# Patient Record
Sex: Male | Born: 1955 | Race: Black or African American | Hispanic: No | Marital: Single | Smoking: Never smoker
Health system: Southern US, Community
[De-identification: ages and names within clinical notes are randomized; demographics above are authoritative.]

## PROBLEM LIST (undated history)

## (undated) DIAGNOSIS — G459 Transient cerebral ischemic attack, unspecified: Secondary | ICD-10-CM

## (undated) DIAGNOSIS — M8700 Idiopathic aseptic necrosis of unspecified bone: Secondary | ICD-10-CM

## (undated) DIAGNOSIS — I10 Essential (primary) hypertension: Secondary | ICD-10-CM

## (undated) DIAGNOSIS — M1990 Unspecified osteoarthritis, unspecified site: Secondary | ICD-10-CM

## (undated) DIAGNOSIS — G479 Sleep disorder, unspecified: Secondary | ICD-10-CM

## (undated) DIAGNOSIS — T7840XA Allergy, unspecified, initial encounter: Secondary | ICD-10-CM

## (undated) HISTORY — DX: Allergy, unspecified, initial encounter: T78.40XA

## (undated) HISTORY — DX: Transient cerebral ischemic attack, unspecified: G45.9

---

## 1987-07-07 HISTORY — PX: FINGER SURGERY: SHX640

## 1998-07-11 ENCOUNTER — Encounter: Admission: RE | Admit: 1998-07-11 | Discharge: 1998-07-11 | Payer: Self-pay | Admitting: Sports Medicine

## 1999-04-04 ENCOUNTER — Emergency Department (HOSPITAL_COMMUNITY): Admission: EM | Admit: 1999-04-04 | Discharge: 1999-04-04 | Payer: Self-pay | Admitting: *Deleted

## 1999-04-04 ENCOUNTER — Encounter: Payer: Self-pay | Admitting: Emergency Medicine

## 2003-07-07 DIAGNOSIS — G459 Transient cerebral ischemic attack, unspecified: Secondary | ICD-10-CM

## 2003-07-07 HISTORY — DX: Transient cerebral ischemic attack, unspecified: G45.9

## 2004-12-04 ENCOUNTER — Emergency Department (HOSPITAL_COMMUNITY): Admission: EM | Admit: 2004-12-04 | Discharge: 2004-12-05 | Payer: Self-pay | Admitting: Emergency Medicine

## 2006-12-31 ENCOUNTER — Ambulatory Visit: Payer: Self-pay | Admitting: *Deleted

## 2006-12-31 ENCOUNTER — Ambulatory Visit: Payer: Self-pay | Admitting: Family Medicine

## 2007-01-14 ENCOUNTER — Ambulatory Visit: Payer: Self-pay | Admitting: Internal Medicine

## 2007-03-11 ENCOUNTER — Ambulatory Visit: Payer: Self-pay | Admitting: Internal Medicine

## 2007-04-14 ENCOUNTER — Encounter (INDEPENDENT_AMBULATORY_CARE_PROVIDER_SITE_OTHER): Payer: Self-pay | Admitting: Nurse Practitioner

## 2007-04-14 ENCOUNTER — Ambulatory Visit: Payer: Self-pay | Admitting: Internal Medicine

## 2007-04-14 LAB — CONVERTED CEMR LAB
ALT: 37 units/L (ref 0–53)
AST: 21 units/L (ref 0–37)
Albumin: 4.5 g/dL (ref 3.5–5.2)
Alkaline Phosphatase: 64 units/L (ref 39–117)
BUN: 16 mg/dL (ref 6–23)
CO2: 27 meq/L (ref 19–32)
Calcium: 9.1 mg/dL (ref 8.4–10.5)
Chloride: 99 meq/L (ref 96–112)
Creatinine, Ser: 1.03 mg/dL (ref 0.40–1.50)
Glucose, Bld: 92 mg/dL (ref 70–99)
Potassium: 3.4 meq/L — ABNORMAL LOW (ref 3.5–5.3)
Sodium: 138 meq/L (ref 135–145)
Total Bilirubin: 2.7 mg/dL — ABNORMAL HIGH (ref 0.3–1.2)
Total Protein: 7.3 g/dL (ref 6.0–8.3)

## 2007-05-02 ENCOUNTER — Ambulatory Visit: Payer: Self-pay | Admitting: Family Medicine

## 2007-05-16 ENCOUNTER — Ambulatory Visit: Payer: Self-pay | Admitting: Family Medicine

## 2007-06-16 ENCOUNTER — Encounter (INDEPENDENT_AMBULATORY_CARE_PROVIDER_SITE_OTHER): Payer: Self-pay | Admitting: Nurse Practitioner

## 2007-06-16 ENCOUNTER — Ambulatory Visit: Payer: Self-pay | Admitting: Internal Medicine

## 2007-06-16 LAB — CONVERTED CEMR LAB: Potassium: 3.6 meq/L (ref 3.5–5.3)

## 2007-08-18 ENCOUNTER — Ambulatory Visit: Payer: Self-pay | Admitting: Internal Medicine

## 2008-02-29 ENCOUNTER — Ambulatory Visit: Payer: Self-pay | Admitting: Internal Medicine

## 2008-02-29 ENCOUNTER — Encounter (INDEPENDENT_AMBULATORY_CARE_PROVIDER_SITE_OTHER): Payer: Self-pay | Admitting: Family Medicine

## 2008-02-29 LAB — CONVERTED CEMR LAB
ALT: 35 units/L (ref 0–53)
AST: 24 units/L (ref 0–37)
Albumin: 4.8 g/dL (ref 3.5–5.2)
Alkaline Phosphatase: 68 units/L (ref 39–117)
BUN: 9 mg/dL (ref 6–23)
CO2: 24 meq/L (ref 19–32)
Calcium: 9.7 mg/dL (ref 8.4–10.5)
Chloride: 100 meq/L (ref 96–112)
Cholesterol: 202 mg/dL — ABNORMAL HIGH (ref 0–200)
Creatinine, Ser: 0.85 mg/dL (ref 0.40–1.50)
Glucose, Bld: 91 mg/dL (ref 70–99)
HDL: 44 mg/dL (ref >39)
LDL Cholesterol: 115 mg/dL — ABNORMAL HIGH (ref 0–99)
PSA: 0.36 ng/mL (ref 0.10–4.00)
Potassium: 3.2 meq/L — ABNORMAL LOW (ref 3.5–5.3)
Sodium: 137 meq/L (ref 135–145)
Testosterone: 234.4 ng/dL — ABNORMAL LOW (ref 350–890)
Total Bilirubin: 2.4 mg/dL — ABNORMAL HIGH (ref 0.3–1.2)
Total CHOL/HDL Ratio: 4.6
Total Protein: 7.9 g/dL (ref 6.0–8.3)
Triglycerides: 217 mg/dL — ABNORMAL HIGH (ref <150)
VLDL: 43 mg/dL — ABNORMAL HIGH (ref 0–40)

## 2011-04-09 ENCOUNTER — Emergency Department (HOSPITAL_COMMUNITY)
Admission: EM | Admit: 2011-04-09 | Discharge: 2011-04-09 | Disposition: A | Discharge status: Home / Self Care | Payer: Self-pay | Attending: Emergency Medicine | Admitting: Emergency Medicine

## 2011-04-09 DIAGNOSIS — L989 Disorder of the skin and subcutaneous tissue, unspecified: Secondary | ICD-10-CM | POA: Insufficient documentation

## 2011-04-09 DIAGNOSIS — N501 Vascular disorders of male genital organs: Secondary | ICD-10-CM | POA: Insufficient documentation

## 2011-04-09 DIAGNOSIS — I10 Essential (primary) hypertension: Secondary | ICD-10-CM | POA: Insufficient documentation

## 2013-12-13 ENCOUNTER — Emergency Department (HOSPITAL_COMMUNITY): Payer: No Typology Code available for payment source

## 2013-12-13 ENCOUNTER — Encounter (HOSPITAL_COMMUNITY): Payer: Self-pay | Admitting: Emergency Medicine

## 2013-12-13 ENCOUNTER — Emergency Department (HOSPITAL_COMMUNITY)
Admission: EM | Admit: 2013-12-13 | Discharge: 2013-12-13 | Disposition: A | Discharge status: Home / Self Care | Payer: No Typology Code available for payment source | Attending: Emergency Medicine | Admitting: Emergency Medicine

## 2013-12-13 DIAGNOSIS — Z79899 Other long term (current) drug therapy: Secondary | ICD-10-CM | POA: Insufficient documentation

## 2013-12-13 DIAGNOSIS — I10 Essential (primary) hypertension: Secondary | ICD-10-CM | POA: Insufficient documentation

## 2013-12-13 DIAGNOSIS — R29810 Facial weakness: Secondary | ICD-10-CM | POA: Insufficient documentation

## 2013-12-13 HISTORY — DX: Essential (primary) hypertension: I10

## 2013-12-13 LAB — CBC WITH DIFFERENTIAL/PLATELET
BASOS ABS: 0 10*3/uL (ref 0.0–0.1)
Basophils Relative: 0 % (ref 0–1)
EOS PCT: 2 % (ref 0–5)
Eosinophils Absolute: 0.1 10*3/uL (ref 0.0–0.7)
HEMATOCRIT: 42.1 % (ref 39.0–52.0)
HEMOGLOBIN: 14.6 g/dL (ref 13.0–17.0)
LYMPHS PCT: 20 % (ref 12–46)
Lymphs Abs: 0.7 10*3/uL (ref 0.7–4.0)
MCH: 29.3 pg (ref 26.0–34.0)
MCHC: 34.7 g/dL (ref 30.0–36.0)
MCV: 84.5 fL (ref 78.0–100.0)
MONO ABS: 0.5 10*3/uL (ref 0.1–1.0)
Monocytes Relative: 13 % — ABNORMAL HIGH (ref 3–12)
Neutro Abs: 2.4 10*3/uL (ref 1.7–7.7)
Neutrophils Relative %: 65 % (ref 43–77)
Platelets: 131 10*3/uL — ABNORMAL LOW (ref 150–400)
RBC: 4.98 MIL/uL (ref 4.22–5.81)
RDW: 12.5 % (ref 11.5–15.5)
WBC: 3.6 10*3/uL — AB (ref 4.0–10.5)

## 2013-12-13 LAB — COMPREHENSIVE METABOLIC PANEL
ALT: 29 U/L (ref 0–53)
AST: 22 U/L (ref 0–37)
Albumin: 3.9 g/dL (ref 3.5–5.2)
Alkaline Phosphatase: 55 U/L (ref 39–117)
BILIRUBIN TOTAL: 2.2 mg/dL — AB (ref 0.3–1.2)
BUN: 7 mg/dL (ref 6–23)
CALCIUM: 8.9 mg/dL (ref 8.4–10.5)
CO2: 25 mEq/L (ref 19–32)
Chloride: 99 mEq/L (ref 96–112)
Creatinine, Ser: 0.73 mg/dL (ref 0.50–1.35)
GFR calc non Af Amer: >90 mL/min (ref >90)
Glucose, Bld: 84 mg/dL (ref 70–99)
Potassium: 3.3 mEq/L — ABNORMAL LOW (ref 3.7–5.3)
Sodium: 140 mEq/L (ref 137–147)
Total Protein: 7.2 g/dL (ref 6.0–8.3)

## 2013-12-13 LAB — TROPONIN I

## 2013-12-13 LAB — PRO B NATRIURETIC PEPTIDE: PRO B NATRI PEPTIDE: 566.7 pg/mL — AB (ref 0–125)

## 2013-12-13 MED ORDER — HYDROCHLOROTHIAZIDE 25 MG PO TABS: 25.0000 mg | ORAL_TABLET | Freq: Every day | ORAL | Status: DC

## 2013-12-13 MED ORDER — CLONIDINE HCL 0.1 MG PO TABS
0.1000 mg | ORAL_TABLET | Freq: Once | ORAL | Status: AC
  Administered 2013-12-13: 0.1 mg via ORAL
  Filled 2013-12-13: qty 1

## 2013-12-13 MED ORDER — LABETALOL HCL 5 MG/ML IV SOLN
20.0000 mg | Freq: Once | INTRAVENOUS | Status: DC
  Filled 2013-12-13: qty 4

## 2013-12-13 NOTE — ED Notes (Signed)
Pt alert no distress 

## 2013-12-13 NOTE — ED Provider Notes (Signed)
CSN: [id number]     Arrival date & time 12/13/13  1047 History   First MD Initiated Contact with Patient 12/13/13 1128     Chief Complaint  Patient presents with  . Hypertension     (Consider location/radiation/quality/duration/timing/severity/associated sxs/prior Treatment) HPI Comments: [facility] from [facility] with elevated blood pressure. He denies any symptoms. He was getting his blood pressure checked at [facility]. He reports he used to be on blood pressure medications but has not taken any medications in the past 5 years. Denies any chest pain, shortness of breath, headache or visual change. Denies any fever or vomiting. Denies abdominal pain. Denies any focal weakness, numbness or tingling. He is a questionable facial droop on exam but he is not certain when this started.  The history is provided by the patient.    Past Medical History  Diagnosis Date  . HTN (hypertension)    History reviewed. No pertinent past surgical history. No family history on file. History  Substance Use Topics  . Smoking status: Never Smoker   . Smokeless tobacco: Not on file  . Alcohol Use: No    Review of Systems  Constitutional: Negative for activity change and appetite change.  Respiratory: Negative for cough, chest tightness and shortness of breath.   Cardiovascular: Negative for chest pain.  Gastrointestinal: Negative for nausea, vomiting and abdominal pain.  Genitourinary: Negative for dysuria and hematuria.  Musculoskeletal: Negative for arthralgias and back pain.  Skin: Negative for rash.  Neurological: Negative for dizziness, syncope and headaches.  A complete 10 system review of systems was obtained and all systems are negative except as noted in the HPI and PMH.      Allergies  Review of patient's allergies indicates no known allergies.  Home Medications   Prior to Admission medications   Medication Sig Start Date End Date Taking? Authorizing Provider   aspirin-acetaminophen-caffeine (EXCEDRIN MIGRAINE) [phone] MG per tablet Take 2 tablets by mouth every 6 (six) hours as needed for headache (pain).   Yes Historical Provider, MD  hydrochlorothiazide (HYDRODIURIL) 25 MG tablet Take 1 tablet (25 mg total) by mouth daily. 12/13/13   Ezequiel Essex, MD   BP 174/111  Pulse 60  Temp(Src) 98.3 F (36.8 C) (Oral)  Resp 20  SpO2 97% Physical Exam  Nursing note and vitals reviewed. Constitutional: He is oriented to person, place, and time. He appears well-developed and well-nourished. No distress.  HENT:  Head: Normocephalic and atraumatic.  Mouth/Throat: Oropharynx is clear and moist. No oropharyngeal exudate.  Eyes: Conjunctivae and EOM are normal. Pupils are equal, round, and reactive to light.  Neck: Normal range of motion. Neck supple.  No meningismus  Cardiovascular: Normal rate, regular rhythm and normal heart sounds.   No murmur heard. +2 femoral, DP, PT pulses  Pulmonary/Chest: Effort normal and breath sounds normal. No respiratory distress.  Abdominal: Soft. There is no tenderness. There is no rebound and no guarding.  Musculoskeletal: Normal range of motion. He exhibits no edema and no tenderness.  No calf swelling, tenderness, or palpable cords  Neurological: He is alert and oriented to person, place, and time. No cranial nerve deficit.  Possible L facial droop with ptosis and forehead flattening. Nasolabial fold flattening on R. no ataxia on finger to nose, no nystagmus, 5/5 strength throughout, no pronator drift, Romberg negative, normal gait.   Skin: Skin is warm and dry. No rash noted.  Psychiatric: He has a normal mood and affect.    ED Course  Procedures (including critical  care time) Labs Review Labs Reviewed  CBC WITH DIFFERENTIAL - Abnormal; Notable for the following:    WBC 3.6 (*)    Platelets 131 (*)    Monocytes Relative 13 (*)    All other components within normal limits  COMPREHENSIVE METABOLIC PANEL -  Abnormal; Notable for the following:    Potassium 3.3 (*)    Total Bilirubin 2.2 (*)    All other components within normal limits  PRO B NATRIURETIC PEPTIDE - Abnormal; Notable for the following:    Pro B Natriuretic peptide (BNP) 566.7 (*)    All other components within normal limits  TROPONIN I    Imaging Review Dg Chest 2 View  12/13/2013   CLINICAL DATA:  Hypertension  EXAM: CHEST  2 VIEW  COMPARISON:  None.  FINDINGS: Cardiac enlargement noted. No pleural effusion or edema. Both lungs are clear. The visualized skeletal structures are unremarkable.  IMPRESSION: 1. Cardiac enlargement. 2. No acute findings.   Electronically Signed   By: Kerby Moors M.D.   On: 12/13/2013 12:55   Ct Head Wo Contrast  12/13/2013   CLINICAL DATA:  Left facial droop.  EXAM: CT HEAD WITHOUT CONTRAST  TECHNIQUE: Contiguous axial images were obtained from the base of the skull through the vertex without contrast.  COMPARISON:  None  FINDINGS: No evidence for acute hemorrhage, mass lesion, midline shift, hydrocephalus or large infarct. There is a small area of cortical atrophy or encephalomalacia involving the anterior left frontal lobe, best seen on sequence 2, image 16. The patient may have an old burr hole along the left upper frontal bone which is probably associated with the small area of encephalomalacia. There is mucosal thickening with a polyp in the left maxillary sinus. Mild mucosal disease in posterior right ethmoid air cells. No acute bone abnormality.  IMPRESSION: No acute intracranial abnormality.  Small focus of encephalomalacia along the left frontal lobe.  Mild paranasal sinus disease.   Electronically Signed   By: Markus Daft M.D.   On: 12/13/2013 12:43   Mr Brain Wo Contrast  12/13/2013   CLINICAL DATA:  58 year old male with hypertension and left facial droop. Initial encounter.  EXAM: MRI HEAD WITHOUT CONTRAST  TECHNIQUE: Multiplanar, multiecho pulse sequences of the brain and surrounding  structures were obtained without intravenous contrast.  COMPARISON:  Head CT without contrast 1216 hr the same day.  FINDINGS: Major intracranial vascular flow voids are preserved. 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 Less pronounced bilateral ICA siphon tortuosity and ectasia.  No restricted diffusion to suggest acute infarction. No midline shift, mass effect, evidence of mass lesion, ventriculomegaly, extra-axial collection or acute intracranial hemorrhage. Cervicomedullary junction and pituitary are within normal limits. Negative visualized cervical spine.  Small focus of chronic encephalomalacia in the anterior left frontal lobe near midline, ste corresponding to the earlier CT finding. Series 5 and series 7, image 18. Chronic blood products at this site (series 8, image 17), and mild bilateral anterior frontal gyrus superficial siderosis.  No other cortical encephalomalacia. Other gray and white matter signal is normal.  Visible internal auditory structures appear normal. Mastoids are clear. Minor paranasal sinus mucosal thickening with small mucous retention cysts. Small superficial scanner subcutaneous cysts about the left lateral orbit (series 5, images 9 and 8). Other scalp soft tissues are within normal limits. Normal bone marrow signal.  IMPRESSION: 1.  No acute intracranial abnormality. 2.  Small focus of encephalomalacia in the anterior left frontal lobe with chronic blood products, and mild regional superficial siderosis. These might be the sequelae of remote trauma.   Electronically Signed   By: Lars Pinks M.D.   On: 12/13/2013 15:07     EKG Interpretation   Date/Time:  Wednesday December 13 2013 10:52:57 EDT Ventricular Rate:  65 PR Interval:  172 QRS Duration: 89 QT Interval:  435 QTC Calculation: 452 R Axis:   75 Text Interpretation:  Sinus rhythm Probable left atrial enlargement Left   ventricular hypertrophy Borderline T abnormalities, inferior leads No  previous ECGs available Confirmed by Wyvonnia Dusky  MD, Marigrace Mccole ([id number]) on  12/13/2013 11:23:44 AM      MDM   Final diagnoses:  Hypertension   [facility]. Denies any symptoms. No headache, chest pain, visual change or shortness of breath.   Neurological exam shows left-sided ptosis and questionable facial droop. Patient is unable to state if  this is new or old. CT without acute abnormality.  MRI with no infarct.  EKG reviewed with Dr. Orene Desanctis who feels T wave inversions represent LVH and not ischemia.  Patient has no chest pain or SOB.  Pressure improved to 185/86. Patient is asymptomatic. No chest pain or shortness of breath. No headache or visual change. Labs show no evidence of endorgan damage. Creatinine is normal. We'll start hydrochlorothiazide. Patient instructed to keep a log of his blood pressure and followup with his PCP.  BP 186/83  Pulse 66  Temp(Src) 98.3 F (36.8 C) (Oral)  Resp 18  SpO2 99%   Ezequiel Essex, MD 12/13/13 1819

## 2013-12-13 NOTE — Discharge Instructions (Signed)
Arterial Hypertension Follow up with the wellness center as scheduled. Keep a record of your blood pressure. Return to the ED if you develop new or worsening symptoms. Arterial hypertension (high blood pressure) is a condition of elevated pressure in your blood vessels. Hypertension over a long period of time is a risk factor for strokes, heart attacks, and heart failure. It is also the leading cause of kidney (renal) failure.  CAUSES   In Adults -- Over 90% of all hypertension has no known cause. This is called essential or primary hypertension. In the other 10% of people with hypertension, the increase in blood pressure is caused by another disorder. This is called secondary hypertension. Important causes of secondary hypertension are:  Heavy alcohol use.  Obstructive sleep apnea.  Hyperaldosterosim (Conn's syndrome).  Steroid use.  Chronic kidney failure.  Hyperparathyroidism.  Medications.  Renal artery stenosis.  Pheochromocytoma.  Cushing's disease.  Coarctation of the aorta.  Scleroderma renal crisis.  Licorice (in excessive amounts).  Drugs (cocaine, methamphetamine). Your caregiver can explain any items above that apply to you.  In Children -- Secondary hypertension is more common and should always be considered.  Pregnancy -- Few women of childbearing age have high blood pressure. However, up to 10% of them develop hypertension of pregnancy. Generally, this will not harm the woman. It may be a sign of 3 complications of pregnancy: preeclampsia, HELLP syndrome, and eclampsia. Follow up and control with medication is necessary. SYMPTOMS   This condition normally does not produce any noticeable symptoms. It is usually found during a routine exam.  Malignant hypertension is a late problem of high blood pressure. It may have the following symptoms:  Headaches.  Blurred vision.  End-organ damage (this means your kidneys, heart, lungs, and other organs are being  damaged).  Stressful situations can increase the blood pressure. If a person with normal blood pressure has their blood pressure go up while being seen by their caregiver, this is often termed "white coat hypertension." Its importance is not known. It may be related with eventually developing hypertension or complications of hypertension.  Hypertension is often confused with mental tension, stress, and anxiety. DIAGNOSIS  The diagnosis is made by 3 separate blood pressure measurements. They are taken at least 1 week apart from each other. If there is organ damage from hypertension, the diagnosis may be made without repeat measurements. Hypertension is usually identified by having blood pressure readings:  Above 140/90 mmHg measured in both arms, at 3 separate times, over a couple weeks.  Over 130/80 mmHg should be considered a risk factor and may require treatment in patients with diabetes. Blood pressure readings over 120/80 mmHg are called "pre-hypertension" even in non-diabetic patients. To get a true blood pressure measurement, use the following guidelines. Be aware of the factors that can alter blood pressure readings.  Take measurements at least 1 hour after caffeine.  Take measurements 30 minutes after smoking and without any stress. This is another reason to quit smoking  it raises your blood pressure.  Use a proper cuff size. Ask your caregiver if you are not sure about your cuff size.  Most home blood pressure cuffs are automatic. They will measure systolic and diastolic pressures. The systolic pressure is the pressure reading at the start of sounds. Diastolic pressure is the pressure at which the sounds disappear. If you are elderly, measure pressures in multiple postures. Try sitting, lying or standing.  Sit at rest for a minimum of 5 minutes before taking  measurements.  You should not be on any medications like decongestants. These are found in many cold medications.  Record  your blood pressure readings and review them with your caregiver. If you have hypertension:  Your caregiver may do tests to be sure you do not have secondary hypertension (see "causes" above).  Your caregiver may also look for signs of metabolic syndrome. This is also called Syndrome X or Insulin Resistance Syndrome. You may have this syndrome if you have type 2 diabetes, abdominal obesity, and abnormal blood lipids in addition to hypertension.  Your caregiver will take your medical and family history and perform a physical exam.  Diagnostic tests may include blood tests (for glucose, cholesterol, potassium, and kidney function), a urinalysis, or an EKG. Other tests may also be necessary depending on your condition. PREVENTION  There are important lifestyle issues that you can adopt to reduce your chance of developing hypertension:  Maintain a normal weight.  Limit the amount of salt (sodium) in your diet.  Exercise often.  Limit alcohol intake.  Get enough potassium in your diet. Discuss specific advice with your caregiver.  Follow a DASH diet (dietary approaches to stop hypertension). This diet is rich in fruits, vegetables, and low-fat dairy products, and avoids certain fats. PROGNOSIS  Essential hypertension cannot be cured. Lifestyle changes and medical treatment can lower blood pressure and reduce complications. The prognosis of secondary hypertension depends on the underlying cause. Many people whose hypertension is controlled with medicine or lifestyle changes can live a normal, healthy life.  RISKS AND COMPLICATIONS  While high blood pressure alone is not an illness, it often requires treatment due to its short- and long-term effects on many organs. Hypertension increases your risk for:  CVAs or strokes (cerebrovascular accident).  Heart failure due to chronically high blood pressure (hypertensive cardiomyopathy).  Heart attack (myocardial infarction).  Damage to the  retina (hypertensive retinopathy).  Kidney failure (hypertensive nephropathy). Your caregiver can explain list items above that apply to you. Treatment of hypertension can significantly reduce the risk of complications. TREATMENT   For overweight patients, weight loss and regular exercise are recommended. Physical fitness lowers blood pressure.  Mild hypertension is usually treated with diet and exercise. A diet rich in fruits and vegetables, fat-free dairy products, and foods low in fat and salt (sodium) can help lower blood pressure. Decreasing salt intake decreases blood pressure in a 1/3 of people.  Stop smoking if you are a smoker. The steps above are highly effective in reducing blood pressure. While these actions are easy to suggest, they are difficult to achieve. Most patients with moderate or severe hypertension end up requiring medications to bring their blood pressure down to a normal level. There are several classes of medications for treatment. Blood pressure pills (antihypertensives) will lower blood pressure by their different actions. Lowering the blood pressure by 10 mmHg may decrease the risk of complications by as much as 25%. The goal of treatment is effective blood pressure control. This will reduce your risk for complications. Your caregiver will help you determine the best treatment for you according to your lifestyle. What is excellent treatment for one person, may not be for you. HOME CARE INSTRUCTIONS   Do not smoke.  Follow the lifestyle changes outlined in the "Prevention" section.  If you are on medications, follow the directions carefully. Blood pressure medications must be taken as prescribed. Skipping doses reduces their benefit. It also puts you at risk for problems.  Follow up with your  caregiver, as directed.  If you are asked to monitor your blood pressure at home, follow the guidelines in the "Diagnosis" section above. SEEK MEDICAL CARE IF:   You think  you are having medication side effects.  You have recurrent headaches or lightheadedness.  You have swelling in your ankles.  You have trouble with your vision. SEEK IMMEDIATE MEDICAL CARE IF:   You have sudden onset of chest pain or pressure, difficulty breathing, or other symptoms of a heart attack.  You have a severe headache.  You have symptoms of a stroke (such as sudden weakness, difficulty speaking, difficulty walking). MAKE SURE YOU:   Understand these instructions.  Will watch your condition.  Will get help right away if you are not doing well or get worse. Document Released: 06/22/2005 Document Revised: 09/14/2011 Document Reviewed: 01/20/2007 [facility] Patient Information 2014 [place].

## 2013-12-13 NOTE — ED Notes (Signed)
A/Ox 4, warm and dry, skin color good.

## 2013-12-13 NOTE — ED Notes (Signed)
Patient transported to CT 

## 2013-12-13 NOTE — Discharge Planning (Signed)
[facility]  Follow up appointment made with Family Medicine @ [facility] for  Wed June 17,2015 at 3:15pm. Patient aware of this appointment. My contact information provided for any future questions or concerns. No other needs expressed at this time.

## 2013-12-13 NOTE — ED Notes (Signed)
Pt in MRI-- was in CT scan prior--will give meds when he returns.

## 2013-12-13 NOTE — ED Notes (Signed)
TO ED via [facility] from [facility] with c/o high blood pressure-- was dx in 2009, has not taken medicine since then, has not had a regular dr since then. Needs to bring BP down in order to get hired at a job. No symptoms, was given Labetolol 100mg  and ASA 324mg  at [facility].

## 2014-05-15 ENCOUNTER — Encounter: Payer: Self-pay | Admitting: Internal Medicine

## 2014-06-11 ENCOUNTER — Emergency Department (HOSPITAL_COMMUNITY): Payer: No Typology Code available for payment source

## 2014-06-11 ENCOUNTER — Emergency Department (INDEPENDENT_AMBULATORY_CARE_PROVIDER_SITE_OTHER)
Admission: EM | Admit: 2014-06-11 | Discharge: 2014-06-11 | Disposition: A | Discharge status: Home / Self Care | Payer: No Typology Code available for payment source | Source: Home / Self Care | Attending: Family Medicine | Admitting: Family Medicine

## 2014-06-11 ENCOUNTER — Encounter (HOSPITAL_COMMUNITY): Payer: Self-pay | Admitting: Emergency Medicine

## 2014-06-11 ENCOUNTER — Emergency Department (INDEPENDENT_AMBULATORY_CARE_PROVIDER_SITE_OTHER): Payer: No Typology Code available for payment source

## 2014-06-11 DIAGNOSIS — M25569 Pain in unspecified knee: Secondary | ICD-10-CM

## 2014-06-11 DIAGNOSIS — E876 Hypokalemia: Secondary | ICD-10-CM

## 2014-06-11 DIAGNOSIS — M1990 Unspecified osteoarthritis, unspecified site: Secondary | ICD-10-CM

## 2014-06-11 DIAGNOSIS — I10 Essential (primary) hypertension: Secondary | ICD-10-CM

## 2014-06-11 MED ORDER — CAPSAICIN 0.075 % EX GEL: CUTANEOUS | Status: DC

## 2014-06-11 MED ORDER — POTASSIUM CHLORIDE ER 20 MEQ PO TBCR: 20.0000 meq | EXTENDED_RELEASE_TABLET | Freq: Every day | ORAL | Status: DC

## 2014-06-11 MED ORDER — HYDROCHLOROTHIAZIDE 50 MG PO TABS: 50.0000 mg | ORAL_TABLET | Freq: Every day | ORAL | Status: DC

## 2014-06-11 MED ORDER — DICLOFENAC SODIUM 75 MG PO TBEC: 75.0000 mg | DELAYED_RELEASE_TABLET | Freq: Two times a day (BID) | ORAL | Status: DC

## 2014-06-11 NOTE — Discharge Instructions (Signed)
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

## 2014-06-11 NOTE — ED Notes (Signed)
Gave pt AVS and went over his d/c instructions.  Pt stated understanding.  Pt does seem to be a bit confused about his BP medications and I advised he contact the PCP that prescribed his medications to clarify what he is to take everyday.  Pt stated he would do that.  Pt walked to d/c independently with all personal belongings.

## 2014-06-11 NOTE — ED Provider Notes (Addendum)
CSN: [id number]     Arrival date & time 06/11/14  0920 History   First MD Initiated Contact with Patient 06/11/14 1109     Chief Complaint  Patient presents with  . Knee Pain    left   (Consider location/radiation/quality/duration/timing/severity/associated sxs/prior Treatment) HPI   HTN: 3 wks ago Dx w/ HTN and started on HCTZ. Takes daily. Denies CP, SOb, Palpitations., HA  L knee started hurting 2 wks ago. Getting worse. Chronic problem for pt. Worse in winters and better in the summers. Topical ointment and ACE wrap w/ minimal benefit. Aleve BID w/ some improvement. Worse w/ ambulation    Past Medical History  Diagnosis Date  . HTN (hypertension)    History reviewed. No pertinent past surgical history. History reviewed. No pertinent family history. History  Substance Use Topics  . Smoking status: Never Smoker   . Smokeless tobacco: Not on file  . Alcohol Use: No    Review of Systems Per HPI with all other pertinent systems negative.   Allergies  Review of patient's allergies indicates no known allergies.  Home Medications   Prior to Admission medications   Medication Sig Start Date End Date Taking? Authorizing Provider  aspirin-acetaminophen-caffeine (EXCEDRIN MIGRAINE) [id number] MG per tablet Take 2 tablets by mouth every 6 (six) hours as needed for headache (pain).    Historical Provider, MD  Capsaicin 0.075 % GEL Apply topically 3-4 times daily for pain relief 06/11/14   Waldemar Dickens, MD  diclofenac (VOLTAREN) 75 MG EC tablet Take 1 tablet (75 mg total) by mouth 2 (two) times daily. 06/11/14   Waldemar Dickens, MD  hydrochlorothiazide (HYDRODIURIL) 50 MG tablet Take 1 tablet (50 mg total) by mouth daily. 06/11/14   Waldemar Dickens, MD  potassium chloride 20 MEQ TBCR Take 20 mEq by mouth daily. 06/11/14   Waldemar Dickens, MD   BP 170/112 mmHg  Pulse 80  Temp(Src) 99.7 F (37.6 C) (Oral)  SpO2 97% Physical Exam  Constitutional: He is oriented to person, place,  and time. He appears well-developed and well-nourished. No distress.  HENT:  Head: Normocephalic and atraumatic.  Eyes: EOM are normal. Pupils are equal, round, and reactive to light.  Neck: Normal range of motion.  Cardiovascular: Normal rate, normal heart sounds and intact distal pulses.   No murmur heard. Pulmonary/Chest: Effort normal and breath sounds normal.  Abdominal: Soft. Bowel sounds are normal.  Musculoskeletal: Normal range of motion.  L knee FROM, nonttp, no effusion. No crepitus, Lachman's negative. Valgus and Varus stresses w/o pain. Pt walks w/ a limp, favoring L. Apprehension test negative.   Neurological: He is alert and oriented to person, place, and time. No cranial nerve deficit. Coordination normal.  Skin: Skin is warm and dry. He is not diaphoretic.  Psychiatric: His behavior is normal. Judgment and thought content normal.    ED Course  Procedures (including critical care time) Labs Review Labs Reviewed - No data to display  Imaging Review No results found.   MDM   1. Arthritis   2. Knee pain   3. Essential hypertension   4. Hypokalemia    Xray official read not available due to technical problems w/ the computers. Mild arthritic changes medially  L knee arthritis: Start voltaren oral, Capsaizin, exercises,   Last K of 3.3 in June. Not taking replacement therapy and on HCTZ now. Start daily Kdur  HTN: marked elevation today. Started on BP meds on 05/21/14. Called pharmacy for clarification and pt  is supporsed to be taking HCTZ, lisinopril and clonidine. Pt unsure of actual regimen. Pt to increase HCTZ to 50mg  if taking other meds as prescribed otherwise take regimen as previously prescribed. . F/u PCP in 1-2 wks   Precautions given and all questions answered  Linna Darner, MD Family Medicine 06/11/2014, 12:08 PM    Waldemar Dickens, MD 06/11/14 [place], MD 06/11/14 1220

## 2014-06-11 NOTE — ED Notes (Signed)
Pt states he fell this summer, but the knee did not bother him then.  He has noticed increased pain in the last week.  Pt also has an elevated blood pressure.  He states he took his BP medication this morning.

## 2014-06-11 NOTE — ED Notes (Signed)
This BP was taken at 1005

## 2014-07-16 ENCOUNTER — Ambulatory Visit: Payer: No Typology Code available for payment source | Admitting: Internal Medicine

## 2014-08-01 ENCOUNTER — Emergency Department (INDEPENDENT_AMBULATORY_CARE_PROVIDER_SITE_OTHER)
Admission: EM | Admit: 2014-08-01 | Discharge: 2014-08-01 | Disposition: A | Discharge status: Home / Self Care | Payer: No Typology Code available for payment source | Source: Home / Self Care | Attending: Family Medicine | Admitting: Family Medicine

## 2014-08-01 ENCOUNTER — Encounter (HOSPITAL_COMMUNITY): Payer: Self-pay | Admitting: *Deleted

## 2014-08-01 DIAGNOSIS — M179 Osteoarthritis of knee, unspecified: Secondary | ICD-10-CM

## 2014-08-01 DIAGNOSIS — M1712 Unilateral primary osteoarthritis, left knee: Secondary | ICD-10-CM

## 2014-08-01 MED ORDER — METHYLPREDNISOLONE ACETATE 40 MG/ML IJ SUSP
INTRAMUSCULAR | Status: AC
  Filled 2014-08-01: qty 1

## 2014-08-01 MED ORDER — DICLOFENAC SODIUM 75 MG PO TBEC: 75.0000 mg | DELAYED_RELEASE_TABLET | Freq: Two times a day (BID) | ORAL | Status: DC

## 2014-08-01 MED ORDER — LIDOCAINE HCL 2 % IJ SOLN
INTRAMUSCULAR | Status: AC
  Filled 2014-08-01: qty 20

## 2014-08-01 MED ORDER — METHYLPREDNISOLONE ACETATE 40 MG/ML IJ SUSP
40.0000 mg | Freq: Once | INTRAMUSCULAR | Status: AC
  Administered 2014-08-01: 40 mg via INTRA_ARTICULAR

## 2014-08-01 NOTE — ED Notes (Signed)
Pt  Reports         Pain  l  Knee           Since the  1st  Of  Jan      -      denys  Any  spefic  Injury          Symptoms  Not  releived  By   OTC meds       Pt requesting  A   Refill  Of  His voltaren

## 2014-08-01 NOTE — Discharge Instructions (Signed)
Joint Injection Care After Refer to this sheet in the next few days. These instructions provide you with information on caring for yourself after you have had a joint injection. Your caregiver also may give you more specific instructions. Your treatment has been planned according to current medical practices, but problems sometimes occur. Call your caregiver if you have any problems or questions after your procedure. After any type of joint injection, it is not uncommon to experience:  Soreness, swelling, or bruising around the injection site.  Mild numbness, tingling, or weakness around the injection site caused by the numbing medicine used before or with the injection. It also is possible to experience the following effects associated with the specific agent after injection:  Iodine-based contrast agents:  Allergic reaction (itching, hives, widespread redness, and swelling beyond the injection site).  Corticosteroids (These effects are rare.):  Allergic reaction.  Increased blood sugar levels (If you have diabetes and you notice that your blood sugar levels have increased, notify your caregiver).  Increased blood pressure levels.  Mood swings.  Hyaluronic acid in the use of viscosupplementation.  Temporary heat or redness.  Temporary rash and itching.  Increased fluid accumulation in the injected joint. These effects all should resolve within a day after your procedure.  HOME CARE INSTRUCTIONS  Limit yourself to light activity the day of your procedure. Avoid lifting heavy objects, bending, stooping, or twisting.  Take prescription or over-the-counter pain medication as directed by your caregiver.  You may apply ice to your injection site to reduce pain and swelling the day of your procedure. Ice may be applied 03-04 times:  Put ice in a plastic bag.  Place a towel between your skin and the bag.  Leave the ice on for no longer than 15-20 minutes each time. SEEK  IMMEDIATE MEDICAL CARE IF:   Pain and swelling get worse rather than better or extend beyond the injection site.  Numbness does not go away.  Blood or fluid continues to leak from the injection site.  You have chest pain.  You have swelling of your face or tongue.  You have trouble breathing or you become dizzy.  You develop a fever, chills, or severe tenderness at the injection site that last longer than 1 day. MAKE SURE YOU:  Understand these instructions.  Watch your condition.  Get help right away if you are not doing well or if you get worse. Document Released: 03/05/2011 Document Revised: 09/14/2011 Document Reviewed: 03/05/2011 [facility] Patient Information 2015 [place], [place]. This information is not intended to replace advice given to you by your health care provider. Make sure you discuss any questions you have with your health care provider.  Arthritis, Nonspecific Arthritis is inflammation of a joint. This usually means pain, redness, warmth or swelling are present. One or more joints may be involved. There are a number of types of arthritis. Your caregiver may not be able to tell what type of arthritis you have right away. CAUSES  The most common cause of arthritis is the wear and tear on the joint (osteoarthritis). This causes damage to the cartilage, which can break down over time. The knees, hips, back and neck are most often affected by this type of arthritis. Other types of arthritis and common causes of joint pain include:  Sprains and other injuries near the joint. Sometimes minor sprains and injuries cause pain and swelling that develop hours later.  Rheumatoid arthritis. This affects hands, feet and knees. It usually affects both sides of  your body at the same time. It is often associated with chronic ailments, fever, weight loss and general weakness.  Crystal arthritis. Gout and pseudo gout can cause occasional acute severe pain, redness and swelling in the  foot, ankle, or knee.  Infectious arthritis. Bacteria can get into a joint through a break in overlying skin. This can cause infection of the joint. Bacteria and viruses can also spread through the blood and affect your joints.  Drug, infectious and allergy reactions. Sometimes joints can become mildly painful and slightly swollen with these types of illnesses. SYMPTOMS   Pain is the main symptom.  Your joint or joints can also be red, swollen and warm or hot to the touch.  You may have a fever with certain types of arthritis, or even feel overall ill.  The joint with arthritis will hurt with movement. Stiffness is present with some types of arthritis. DIAGNOSIS  Your caregiver will suspect arthritis based on your description of your symptoms and on your exam. Testing may be needed to find the type of arthritis:  Blood and sometimes urine tests.  X-ray tests and sometimes CT or MRI scans.  Removal of fluid from the joint (arthrocentesis) is done to check for bacteria, crystals or other causes. Your caregiver (or a specialist) will numb the area over the joint with a local anesthetic, and use a needle to remove joint fluid for examination. This procedure is only minimally uncomfortable.  Even with these tests, your caregiver may not be able to tell what kind of arthritis you have. Consultation with a specialist (rheumatologist) may be helpful. TREATMENT  Your caregiver will discuss with you treatment specific to your type of arthritis. If the specific type cannot be determined, then the following general recommendations may apply. Treatment of severe joint pain includes:  Rest.  Elevation.  Anti-inflammatory medication (for example, ibuprofen) may be prescribed. Avoiding activities that cause increased pain.  Only take over-the-counter or prescription medicines for pain and discomfort as recommended by your caregiver.  Cold packs over an inflamed joint may be used for 10 to 15  minutes every hour. Hot packs sometimes feel better, but do not use overnight. Do not use hot packs if you are diabetic without your caregiver's permission.  A cortisone shot into arthritic joints may help reduce pain and swelling.  Any acute arthritis that gets worse over the next 1 to 2 days needs to be looked at to be sure there is no joint infection. Long-term arthritis treatment involves modifying activities and lifestyle to reduce joint stress jarring. This can include weight loss. Also, exercise is needed to nourish the joint cartilage and remove waste. This helps keep the muscles around the joint strong. HOME CARE INSTRUCTIONS   Do not take aspirin to relieve pain if gout is suspected. This elevates uric acid levels.  Only take over-the-counter or prescription medicines for pain, discomfort or fever as directed by your caregiver.  Rest the joint as much as possible.  If your joint is swollen, keep it elevated.  Use crutches if the painful joint is in your leg.  Drinking plenty of fluids may help for certain types of arthritis.  Follow your caregiver's dietary instructions.  Try low-impact exercise such as:  Swimming.  Water aerobics.  Biking.  Walking.  Morning stiffness is often relieved by a warm shower.  Put your joints through regular range-of-motion. SEEK MEDICAL CARE IF:   You do not feel better in 24 hours or are getting worse.  You have side effects to medications, or are not getting better with treatment. SEEK IMMEDIATE MEDICAL CARE IF:   You have a fever.  You develop severe joint pain, swelling or redness.  Many joints are involved and become painful and swollen.  There is severe back pain and/or leg weakness.  You have loss of bowel or bladder control. Document Released: 07/30/2004 Document Revised: 09/14/2011 Document Reviewed: 08/15/2008 [employer] Patient Information 2015 [place], [place]. This information is not intended to replace advice given  to you by your health care provider. Make sure you discuss any questions you have with your health care provider.  Knee Pain The knee is the complex joint between your thigh and your lower leg. It is made up of bones, tendons, ligaments, and cartilage. The bones that make up the knee are:  The femur in the thigh.  The tibia and fibula in the lower leg.  The patella or kneecap riding in the groove on the lower femur. CAUSES  Knee pain is a common complaint with many causes. A few of these causes are:  Injury, such as:  A ruptured ligament or tendon injury.  Torn cartilage.  Medical conditions, such as:  Gout  Arthritis  Infections  Overuse, over training, or overdoing a physical activity. Knee pain can be minor or severe. Knee pain can accompany debilitating injury. Minor knee problems often respond well to self-care measures or get well on their own. More serious injuries may need medical intervention or even surgery. SYMPTOMS The knee is complex. Symptoms of knee problems can vary widely. Some of the problems are:  Pain with movement and weight bearing.  Swelling and tenderness.  Buckling of the knee.  Inability to straighten or extend your knee.  Your knee locks and you cannot straighten it.  Warmth and redness with pain and fever.  Deformity or dislocation of the kneecap. DIAGNOSIS  Determining what is wrong may be very straight forward such as when there is an injury. It can also be challenging because of the complexity of the knee. Tests to make a diagnosis may include:  Your caregiver taking a history and doing a physical exam.  Routine X-rays can be used to rule out other problems. X-rays will not reveal a cartilage tear. Some injuries of the knee can be diagnosed by:  Arthroscopy a surgical technique by which a small video camera is inserted through tiny incisions on the sides of the knee. This procedure is used to examine and repair internal knee joint  problems. Tiny instruments can be used during arthroscopy to repair the torn knee cartilage (meniscus).  Arthrography is a radiology technique. A contrast liquid is directly injected into the knee joint. Internal structures of the knee joint then become visible on X-ray film.  An MRI scan is a non X-ray radiology procedure in which magnetic fields and a computer produce two- or three-dimensional images of the inside of the knee. Cartilage tears are often visible using an MRI scanner. MRI scans have largely replaced arthrography in diagnosing cartilage tears of the knee.  Blood work.  Examination of the fluid that helps to lubricate the knee joint (synovial fluid). This is done by taking a sample out using a needle and a syringe. TREATMENT The treatment of knee problems depends on the cause. Some of these treatments are:  Depending on the injury, proper casting, splinting, surgery, or physical therapy care will be needed.  Give yourself adequate recovery time. Do not overuse your joints. If you begin  to get sore during workout routines, back off. Slow down or do fewer repetitions.  For repetitive activities such as cycling or running, maintain your strength and nutrition.  Alternate muscle groups. For example, if you are a weight lifter, work the upper body on one day and the lower body the next.  Either tight or weak muscles do not give the proper support for your knee. Tight or weak muscles do not absorb the stress placed on the knee joint. Keep the muscles surrounding the knee strong.  Take care of mechanical problems.  If you have flat feet, orthotics or special shoes may help. See your caregiver if you need help.  Arch supports, sometimes with wedges on the inner or outer aspect of the heel, can help. These can shift pressure away from the side of the knee most bothered by osteoarthritis.  A brace called an "unloader" brace also may be used to help ease the pressure on the most  arthritic side of the knee.  If your caregiver has prescribed crutches, braces, wraps or ice, use as directed. The acronym for this is PRICE. This means protection, rest, ice, compression, and elevation.  Nonsteroidal anti-inflammatory drugs (NSAIDs), can help relieve pain. But if taken immediately after an injury, they may actually increase swelling. Take NSAIDs with food in your stomach. Stop them if you develop stomach problems. Do not take these if you have a history of ulcers, stomach pain, or bleeding from the bowel. Do not take without your caregiver's approval if you have problems with fluid retention, heart failure, or kidney problems.  For ongoing knee problems, physical therapy may be helpful.  Glucosamine and chondroitin are over-the-counter dietary supplements. Both may help relieve the pain of osteoarthritis in the knee. These medicines are different from the usual anti-inflammatory drugs. Glucosamine may decrease the rate of cartilage destruction.  Injections of a corticosteroid drug into your knee joint may help reduce the symptoms of an arthritis flare-up. They may provide pain relief that lasts a few months. You may have to wait a few months between injections. The injections do have a small increased risk of infection, water retention, and elevated blood sugar levels.  Hyaluronic acid injected into damaged joints may ease pain and provide lubrication. These injections may work by reducing inflammation. A series of shots may give relief for as long as 6 months.  Topical painkillers. Applying certain ointments to your skin may help relieve the pain and stiffness of osteoarthritis. Ask your pharmacist for suggestions. Many over the-counter products are approved for temporary relief of arthritis pain.  In some countries, doctors often prescribe topical NSAIDs for relief of chronic conditions such as arthritis and tendinitis. A review of treatment with NSAID creams found that they  worked as well as oral medications but without the serious side effects. PREVENTION  Maintain a healthy weight. Extra pounds put more strain on your joints.  Get strong, stay limber. Weak muscles are a common cause of knee injuries. Stretching is important. Include flexibility exercises in your workouts.  Be smart about exercise. If you have osteoarthritis, chronic knee pain or recurring injuries, you may need to change the way you exercise. This does not mean you have to stop being active. If your knees ache after jogging or playing basketball, consider switching to swimming, water aerobics, or other low-impact activities, at least for a few days a week. Sometimes limiting high-impact activities will provide relief.  Make sure your shoes fit well. Choose footwear that is right  for your sport.  Protect your knees. Use the proper gear for knee-sensitive activities. Use kneepads when playing volleyball or laying carpet. Buckle your seat belt every time you drive. Most shattered kneecaps occur in car accidents.  Rest when you are tired. SEEK MEDICAL CARE IF:  You have knee pain that is continual and does not seem to be getting better.  SEEK IMMEDIATE MEDICAL CARE IF:  Your knee joint feels hot to the touch and you have a high fever. MAKE SURE YOU:   Understand these instructions.  Will watch your condition.  Will get help right away if you are not doing well or get worse. Document Released: 04/19/2007 Document Revised: 09/14/2011 Document Reviewed: 04/19/2007 [facility] Patient Information 2015 [place], [place]. This information is not intended to replace advice given to you by your health care provider. Make sure you discuss any questions you have with your health care provider.  Knee Exercises EXERCISES RANGE OF MOTION (ROM) AND STRETCHING EXERCISES These exercises may help you when beginning to rehabilitate your injury. Your symptoms may resolve with or without further involvement from  your physician, physical therapist, or athletic trainer. While completing these exercises, remember:   Restoring tissue flexibility helps normal motion to return to the joints. This allows healthier, less painful movement and activity.  An effective stretch should be held for at least 30 seconds.  A stretch should never be painful. You should only feel a gentle lengthening or release in the stretched tissue. STRETCH - Knee Extension, Prone  Lie on your stomach on a firm surface, such as a bed or countertop. Place your right / left knee and leg just beyond the edge of the surface. You may wish to place a towel under the far end of your right / left thigh for comfort.  Relax your leg muscles and allow gravity to straighten your knee. Your clinician may advise you to add an ankle weight if more resistance is helpful for you.  You should feel a stretch in the back of your right / left knee. Hold this position for __________ seconds. Repeat __________ times. Complete this stretch __________ times per day. * Your physician, physical therapist, or athletic trainer may ask you to add ankle weight to enhance your stretch.  RANGE OF MOTION - Knee Flexion, Active  Lie on your back with both knees straight. (If this causes back discomfort, bend your opposite knee, placing your foot flat on the floor.)  Slowly slide your heel back toward your buttocks until you feel a gentle stretch in the front of your knee or thigh.  Hold for __________ seconds. Slowly slide your heel back to the starting position. Repeat __________ times. Complete this exercise __________ times per day.  STRETCH - Quadriceps, Prone   Lie on your stomach on a firm surface, such as a bed or padded floor.  Bend your right / left knee and grasp your ankle. If you are unable to reach your ankle or pant leg, use a belt around your foot to lengthen your reach.  Gently pull your heel toward your buttocks. Your knee should not slide out  to the side. You should feel a stretch in the front of your thigh and/or knee.  Hold this position for __________ seconds. Repeat __________ times. Complete this stretch __________ times per day.  STRETCH - Hamstrings, Supine   Lie on your back. Loop a belt or towel over the ball of your right / left foot.  Straighten your right / left  knee and slowly pull on the belt to raise your leg. Do not allow the right / left knee to bend. Keep your opposite leg flat on the floor.  Raise the leg until you feel a gentle stretch behind your right / left knee or thigh. Hold this position for __________ seconds. Repeat __________ times. Complete this stretch __________ times per day.  STRENGTHENING EXERCISES These exercises may help you when beginning to rehabilitate your injury. They may resolve your symptoms with or without further involvement from your physician, physical therapist, or athletic trainer. While completing these exercises, remember:   Muscles can gain both the endurance and the strength needed for everyday activities through controlled exercises.  Complete these exercises as instructed by your physician, physical therapist, or athletic trainer. Progress the resistance and repetitions only as guided.  You may experience muscle soreness or fatigue, but the pain or discomfort you are trying to eliminate should never worsen during these exercises. If this pain does worsen, stop and make certain you are following the directions exactly. If the pain is still present after adjustments, discontinue the exercise until you can discuss the trouble with your clinician. STRENGTH - Quadriceps, Isometrics  Lie on your back with your right / left leg extended and your opposite knee bent.  Gradually tense the muscles in the front of your right / left thigh. You should see either your knee cap slide up toward your hip or increased dimpling just above the knee. This motion will push the back of the knee down  toward the floor/mat/bed on which you are lying.  Hold the muscle as tight as you can without increasing your pain for __________ seconds.  Relax the muscles slowly and completely in between each repetition. Repeat __________ times. Complete this exercise __________ times per day.  STRENGTH - Quadriceps, Short Arcs   Lie on your back. Place a __________ inch towel roll under your knee so that the knee slightly bends.  Raise only your lower leg by tightening the muscles in the front of your thigh. Do not allow your thigh to rise.  Hold this position for __________ seconds. Repeat __________ times. Complete this exercise __________ times per day.  OPTIONAL ANKLE WEIGHTS: Begin with ____________________, but DO NOT exceed ____________________. Increase in 1 pound/0.5 kilogram increments.  STRENGTH - Quadriceps, Straight Leg Raises  Quality counts! Watch for signs that the quadriceps muscle is working to insure you are strengthening the correct muscles and not "cheating" by substituting with healthier muscles.  Lay on your back with your right / left leg extended and your opposite knee bent.  Tense the muscles in the front of your right / left thigh. You should see either your knee cap slide up or increased dimpling just above the knee. Your thigh may even quiver.  Tighten these muscles even more and raise your leg 4 to 6 inches off the floor. Hold for __________ seconds.  Keeping these muscles tense, lower your leg.  Relax the muscles slowly and completely in between each repetition. Repeat __________ times. Complete this exercise __________ times per day.  STRENGTH - Hamstring, Curls  Lay on your stomach with your legs extended. (If you lay on a bed, your feet may hang over the edge.)  Tighten the muscles in the back of your thigh to bend your right / left knee up to 90 degrees. Keep your hips flat on the bed/floor.  Hold this position for __________ seconds.  Slowly lower your leg  back to  the starting position. Repeat __________ times. Complete this exercise __________ times per day.  OPTIONAL ANKLE WEIGHTS: Begin with ____________________, but DO NOT exceed ____________________. Increase in 1 pound/0.5 kilogram increments.  STRENGTH - Quadriceps, Squats  Stand in a door frame so that your feet and knees are in line with the frame.  Use your hands for balance, not support, on the frame.  Slowly lower your weight, bending at the hips and knees. Keep your lower legs upright so that they are parallel with the door frame. Squat only within the range that does not increase your knee pain. Never let your hips drop below your knees.  Slowly return upright, pushing with your legs, not pulling with your hands. Repeat __________ times. Complete this exercise __________ times per day.  STRENGTH - Quadriceps, Wall Slides  Follow guidelines for form closely. Increased knee pain often results from poorly placed feet or knees.  Lean against a smooth wall or door and walk your feet out 18-24 inches. Place your feet hip-width apart.  Slowly slide down the wall or door until your knees bend __________ degrees.* Keep your knees over your heels, not your toes, and in line with your hips, not falling to either side.  Hold for __________ seconds. Stand up to rest for __________ seconds in between each repetition. Repeat __________ times. Complete this exercise __________ times per day. * Your physician, physical therapist, or athletic trainer will alter this angle based on your symptoms and progress. Document Released: 05/06/2005 Document Revised: 11/06/2013 Document Reviewed: 10/04/2008 [facility] Patient Information 2015 [place], [place]. This information is not intended to replace advice given to you by your health care provider. Make sure you discuss any questions you have with your health care provider.

## 2014-08-01 NOTE — ED Provider Notes (Signed)
CSN: [id number]     Arrival date & time 08/01/14  1013 History   First MD Initiated Contact with Patient 08/01/14 1026     Chief Complaint  Patient presents with  . Knee Pain   (Consider location/radiation/quality/duration/timing/severity/associated sxs/prior Treatment) HPI           59 year old male presents complaining of left knee pain. He has had left knee pain for about 6 months. 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 He was started on diclofenac 75 mg twice a day. He has been taking this and it is somewhat helpful but he has run out. The pain is worse in the morning and gets better throughout the day. He is requesting a referral to an orthopedist for joint injection today. No extremity numbness, weakness, or swelling. There is mild swelling in the medial portion of the knee.   Past Medical History  Diagnosis Date  . HTN (hypertension)    History reviewed. No pertinent past surgical history. History reviewed. No pertinent family history. History  Substance Use Topics  . Smoking status: Never Smoker   . Smokeless tobacco: Not on file  . Alcohol Use: No    Review of Systems  Musculoskeletal: Positive for joint swelling and arthralgias.  All other systems reviewed and are negative.   Allergies  Review of patient's allergies indicates no known allergies.  Home Medications   Prior to Admission medications   Medication Sig Start Date End Date Taking? Authorizing Provider  aspirin-acetaminophen-caffeine (EXCEDRIN MIGRAINE) [phone] MG per tablet Take 2 tablets by mouth every 6 (six) hours as needed for headache (pain).    Historical Provider, MD  Capsaicin 0.075 % GEL Apply topically 3-4 times daily for pain relief 06/11/14   Waldemar Dickens, MD  diclofenac (VOLTAREN) 75 MG EC tablet Take 1 tablet (75 mg total) by mouth 2 (two) times daily. 08/01/14   Freeman Caldron Reilyn Nelson, PA-C   hydrochlorothiazide (HYDRODIURIL) 50 MG tablet Take 1 tablet (50 mg total) by mouth daily. 06/11/14   Waldemar Dickens, MD  potassium chloride 20 MEQ TBCR Take 20 mEq by mouth daily. 06/11/14   Waldemar Dickens, MD   BP 161/85 mmHg  Pulse 81  Temp(Src) 98.3 F (36.8 C) (Oral)  Resp 16  SpO2 97% Physical Exam  Constitutional: He is oriented to person, place, and time. He appears well-developed and well-nourished. No distress.  HENT:  Head: Normocephalic.  Neck: No tracheal deviation present.  Cardiovascular: Intact distal pulses.   Pulmonary/Chest: Effort normal. No respiratory distress.  Musculoskeletal:       Left knee: He exhibits decreased range of motion and swelling. He exhibits no effusion, no deformity, no erythema, no LCL laxity, normal patellar mobility, normal meniscus and no MCL laxity. Tenderness found. Medial joint line tenderness noted.  Neurological: He is alert and oriented to person, place, and time. Gait (antalgic, favoring left) abnormal. Coordination normal.  Skin: Skin is warm and dry. No rash noted. He is not diaphoretic.  Psychiatric: He has a normal mood and affect. Judgment normal.  Nursing note and vitals reviewed.   ED Course  Injection of joint Date/Time: 08/01/2014 11:18 AM Performed by: Allena Katz, H Authorized by: Lynne Leader, S Consent: Verbal consent obtained. Risks and benefits: risks, benefits and alternatives were discussed Consent given by: patient Patient identity confirmed: verbally with patient Time out: Immediately prior to procedure a "time out"  was called to verify the correct patient, procedure, equipment, support staff and site/side marked as required. Preparation: Patient was prepped and draped in the usual sterile fashion. Local anesthesia used: yes (topical anethetic cold spray ) Patient sedated: no Patient tolerance: Patient tolerated the procedure well with no immediate complications Comments: 40 mg of Depo-Medrol and 4 mL of 2%  lidocaine injected in the knee, superior lateral approach with the knee in full extension, successful injection with no complications   (including critical care time) Labs Review Labs Reviewed - No data to display  Imaging Review No results found.   MDM   1. Osteoarthritis of left knee, unspecified osteoarthritis type    Risks and benefits of joint injection were discussed with the patient, he elects to go ahead injection today and follow-up with orthopedics.  Joint injection performed with no palpitations and near immediate relief of symptoms. Follow-up with orthopedics   Meds ordered this encounter  Medications  . methylPREDNISolone acetate (DEPO-MEDROL) injection 40 mg    Sig:   . diclofenac (VOLTAREN) 75 MG EC tablet    Sig: Take 1 tablet (75 mg total) by mouth 2 (two) times daily.    Dispense:  60 tablet    Refill:  5    Order Specific Question:  Supervising Provider    Answer:  Lynne Leader, Juliaetta       Liam Graham, PA-C 08/01/14 1120

## 2014-11-21 ENCOUNTER — Encounter: Payer: Self-pay | Admitting: Family

## 2014-11-21 ENCOUNTER — Ambulatory Visit (INDEPENDENT_AMBULATORY_CARE_PROVIDER_SITE_OTHER): Payer: BC Managed Care – PPO | Admitting: Family

## 2014-11-21 VITALS — BP 140/78 | HR 74 | Temp 97.9°F | Ht 69.5 in | Wt 201.1 lb

## 2014-11-21 DIAGNOSIS — Z0000 Encounter for general adult medical examination without abnormal findings: Secondary | ICD-10-CM

## 2014-11-21 MED ORDER — LISINOPRIL 40 MG PO TABS: 40.0000 mg | ORAL_TABLET | Freq: Every day | ORAL | Status: DC

## 2014-11-21 MED ORDER — CLONIDINE HCL 0.2 MG PO TABS: 0.2000 mg | ORAL_TABLET | Freq: Every day | ORAL | Status: DC

## 2014-11-21 NOTE — Progress Notes (Signed)
Pre visit review using our clinic review tool, if applicable. No additional management support is needed unless otherwise documented below in the visit note. 

## 2014-11-21 NOTE — Progress Notes (Signed)
Subjective:    Patient ID: Brandon Colon, male    DOB: Sep 04, 1955, 59 y.o.   MRN: [id number]   Chief Complaint  Patient presents with  . Annual Exam  . Establish Care    HPI:  Brandon Colon is a 59 y.o. male who presents today for an annual wellness visit.   1) Health Maintenance -   Diet - Averages 2-3 meals per day consisting of vegetables, fruits and [place] sandwiches; Occasional caffeine.  Exercise - Does a lot of walking around work.    2) Preventative Exams / Immunizations:  Dental -- Due for exam  Vision -- Due for exam   Health Maintenance  Topic Date Due  . HIV Screening  10/28/1970  . COLONOSCOPY  10/27/2005  . INFLUENZA VACCINE  02/04/2015  . TETANUS/TDAP  12/20/2023     There is no immunization history on file for this patient.   No Known Allergies   Outpatient Prescriptions Prior to Visit  Medication Sig Dispense Refill  . aspirin-acetaminophen-caffeine (EXCEDRIN MIGRAINE) 250-250-65 MG per tablet Take 2 tablets by mouth every 6 (six) hours as needed for headache (pain).    . Capsaicin 0.075 % GEL Apply topically 3-4 times daily for pain relief 1 Tube 0  . diclofenac (VOLTAREN) 75 MG EC tablet Take 1 tablet (75 mg total) by mouth 2 (two) times daily. 60 tablet 5  . hydrochlorothiazide (HYDRODIURIL) 50 MG tablet Take 1 tablet (50 mg total) by mouth daily. 30 tablet 0  . potassium chloride 20 MEQ TBCR Take 20 mEq by mouth daily. 20 tablet 0   No facility-administered medications prior to visit.     Past Medical History  Diagnosis Date  . HTN (hypertension)      History reviewed. No pertinent past surgical history.   Family History  Problem Relation Age of Onset  . Heart disease Mother   . Hypertension Mother   . Heart disease Father   . Hypertension Father   . Hypertension Maternal Grandmother   . Hypertension Maternal Grandfather   . Hypertension Paternal Grandmother   . Hypertension Paternal Grandfather      History    Social History  . Marital Status: Single    Spouse Name: N/A  . Number of Children: 0  . Years of Education: 12   Occupational History  . Custodian    Social History Main Topics  . Smoking status: Never Smoker   . Smokeless tobacco: Never Used  . Alcohol Use: Yes     Comment: occasionally  . Drug Use: No  . Sexual Activity: Not on file   Other Topics Concern  . Not on file   Social History Narrative   Fun: Running, movies, watch TV   Denies religious beliefs effecting health care.      Review of Systems  Constitutional: Denies fever, chills, fatigue, or significant weight gain/loss. HENT: Head: Denies headache or neck pain Ears: Denies changes in hearing, ringing in ears, earache, drainage Nose: Denies discharge, stuffiness, itching, nosebleed, sinus pain Throat: Denies sore throat, hoarseness, dry mouth, sores, thrush Eyes: Denies loss/changes in vision, pain, redness, blurry/double vision, flashing lights Cardiovascular: Denies chest pain/discomfort, tightness, palpitations, shortness of breath with activity, difficulty lying down, swelling, sudden awakening with shortness of breath Respiratory: Denies shortness of breath, cough, sputum production, wheezing Gastrointestinal: Denies dysphasia, heartburn, change in appetite, nausea, change in bowel habits, rectal bleeding, constipation, diarrhea, yellow skin or eyes Genitourinary: Denies frequency, urgency, burning/pain, blood in urine, incontinence, change in urinary  strength. Musculoskeletal: Denies muscle/joint pain (other than below) stiffness, back pain, redness or swelling of joints, trauma Left knee pain Skin: Denies rashes, lumps, itching, dryness, color changes, or hair/nail changes Neurological: Denies dizziness, fainting, seizures, weakness, numbness, tingling, tremor Psychiatric - Denies nervousness, stress, depression or memory loss Endocrine: Denies heat or cold intolerance, sweating, frequent urination,  excessive thirst, changes in appetite Hematologic: Denies ease of bruising or bleeding     Objective:    BP 140/78 mmHg  Pulse 74  Temp(Src) 97.9 F (36.6 C) (Oral)  Ht 5' 9.5" (1.765 m)  Wt 201 lb 2 oz (91.23 kg)  BMI 29.29 kg/m2  SpO2 98%  Nursing note and vital signs reviewed.  Physical Exam  Constitutional: He is oriented to person, place, and time. He appears well-developed and well-nourished.  HENT:  Head: Normocephalic.  Right Ear: Hearing, tympanic membrane, external ear and ear canal normal.  Left Ear: Hearing, tympanic membrane, external ear and ear canal normal.  Nose: Nose normal.  Mouth/Throat: Uvula is midline, oropharynx is clear and moist and mucous membranes are normal. Abnormal dentition.  Eyes: Conjunctivae and EOM are normal. Pupils are equal, round, and reactive to light.  Neck: Neck supple. No JVD present. No tracheal deviation present. No thyromegaly present.  Cardiovascular: Normal rate, regular rhythm, normal heart sounds and intact distal pulses.   Pulmonary/Chest: Effort normal and breath sounds normal.  Abdominal: Soft. Bowel sounds are normal. He exhibits no distension and no mass. There is no tenderness. There is no rebound and no guarding.  Musculoskeletal: Normal range of motion. He exhibits no edema or tenderness.  Lymphadenopathy:    He has no cervical adenopathy.  Neurological: He is alert and oriented to person, place, and time. He has normal reflexes. No cranial nerve deficit. He exhibits normal muscle tone. Coordination normal.  Skin: Skin is warm and dry.  Psychiatric: He has a normal mood and affect. His behavior is normal. Judgment and thought content normal.       Assessment & Plan:

## 2014-11-21 NOTE — Assessment & Plan Note (Addendum)
1) Anticipatory Guidance: Discussed importance of wearing a seatbelt while driving and not texting while driving; changing batteries in smoke detector at least once annually; wearing suntan lotion when outside; eating a balanced and moderate diet; getting physical activity at least 30 minutes per day.  2) Immunizations / Screenings / Labs:  Immunizations are up-to-date per recommendations. Due for dental and vision screens which will be scheduled independently. Due for colonoscopy; referral to gastroenterology placed. Other screenings are up-to-date per recommendations. Obtain CBC, BMET, Lipid profile and TSH when fasting.   Overall medical exam. Cardiovascular risk factors include hypertension and being overweight. Hypertension is currently well controlled with clonidine and lisinopril. Continue current dosages of clonidine and lisinopril. Discussed importance of increasing physical activity 30 minutes most days of the week of moderate level intensity. Continue with nutrient dense diet to optimize nutrition. Scheduled to have a knee MRI through orthopedics and possible surgery. Prevention exam in one year. Follow-up office visit pending lab work or 6 months.

## 2014-11-21 NOTE — Patient Instructions (Signed)
Thank you for choosing [facility].  Summary/Instructions:  Your prescription(s) have been submitted to your pharmacy or been printed and provided for you. Please take as directed and contact our office if you believe you are having problem(s) with the medication(s) or have any questions.  Please stop by the lab on the basement level of the building for your blood work. Your results will be released to [facility] (or called to you) after review, usually within 72 hours after test completion. If any changes need to be made, you will be notified at that same time.  Health Maintenance A healthy lifestyle and preventative care can promote health and wellness.  Maintain regular health, dental, and eye exams.  Eat a healthy diet. Foods like vegetables, fruits, whole grains, low-fat dairy products, and lean protein foods contain the nutrients you need and are low in calories. Decrease your intake of foods high in solid fats, added sugars, and salt. Get information about a proper diet from your health care provider, if necessary.  Regular physical exercise is one of the most important things you can do for your health. Most adults should get at least 150 minutes of moderate-intensity exercise (any activity that increases your heart rate and causes you to sweat) each week. In addition, most adults need muscle-strengthening exercises on 2 or more days a week.   Maintain a healthy weight. The body mass index (BMI) is a screening tool to identify possible weight problems. It provides an estimate of body fat based on height and weight. Your health care provider can find your BMI and can help you achieve or maintain a healthy weight. For males 20 years and older:  A BMI below 18.5 is considered underweight.  A BMI of 18.5 to 24.9 is normal.  A BMI of 25 to 29.9 is considered overweight.  A BMI of 30 and above is considered obese.  Maintain normal blood lipids and cholesterol by exercising and  minimizing your intake of saturated fat. Eat a balanced diet with plenty of fruits and vegetables. Blood tests for lipids and cholesterol should begin at age 68 and be repeated every 5 years. If your lipid or cholesterol levels are high, you are over age 44, or you are at high risk for heart disease, you may need your cholesterol levels checked more frequently.Ongoing high lipid and cholesterol levels should be treated with medicines if diet and exercise are not working.  If you smoke, find out from your health care provider how to quit. If you do not use tobacco, do not start.  Lung cancer screening is recommended for adults aged 53-80 years who are at high risk for developing lung cancer because of a history of smoking. A yearly low-dose CT scan of the lungs is recommended for people who have at least a 30-pack-year history of smoking and are current smokers or have quit within the past 15 years. A pack year of smoking is smoking an average of 1 pack of cigarettes a day for 1 year (for example, a 30-pack-year history of smoking could mean smoking 1 pack a day for 30 years or 2 packs a day for 15 years). Yearly screening should continue until the smoker has stopped smoking for at least 15 years. Yearly screening should be stopped for people who develop a health problem that would prevent them from having lung cancer treatment.  If you choose to drink alcohol, do not have more than 2 drinks per day. One drink is considered to be 12  oz (360 mL) of beer, 5 oz (150 mL) of wine, or 1.5 oz (45 mL) of liquor.  Avoid the use of street drugs. Do not share needles with anyone. Ask for help if you need support or instructions about stopping the use of drugs.  High blood pressure causes heart disease and increases the risk of stroke. Blood pressure should be checked at least every 1-2 years. Ongoing high blood pressure should be treated with medicines if weight loss and exercise are not effective.  If you are  64-59 years old, ask your health care provider if you should take aspirin to prevent heart disease.  Diabetes screening involves taking a blood sample to check your fasting blood sugar level. This should be done once every 3 years after age 68 if you are at a normal weight and without risk factors for diabetes. Testing should be considered at a younger age or be carried out more frequently if you are overweight and have at least 1 risk factor for diabetes.  Colorectal cancer can be detected and often prevented. Most routine colorectal cancer screening begins at the age of 41 and continues through age 60. However, your health care provider may recommend screening at an earlier age if you have risk factors for colon cancer. On a yearly basis, your health care provider may provide home test kits to check for hidden blood in the stool. A small camera at the end of a tube may be used to directly examine the colon (sigmoidoscopy or colonoscopy) to detect the earliest forms of colorectal cancer. Talk to your health care provider about this at age 64 when routine screening begins. A direct exam of the colon should be repeated every 5-10 years through age 47, unless early forms of precancerous polyps or small growths are found.  People who are at an increased risk for hepatitis B should be screened for this virus. You are considered at high risk for hepatitis B if:  You were born in a country where hepatitis B occurs often. Talk with your health care provider about which countries are considered high risk.  Your parents were born in a high-risk country and you have not received a shot to protect against hepatitis B (hepatitis B vaccine).  You have HIV or AIDS.  You use needles to inject street drugs.  You live with, or have sex with, someone who has hepatitis B.  You are a man who has sex with other men (MSM).  You get hemodialysis treatment.  You take certain medicines for conditions like cancer, organ  transplantation, and autoimmune conditions.  Hepatitis C blood testing is recommended for all people born from [birth year] through 1965 and any individual with known risk factors for hepatitis C.  Healthy men should no longer receive prostate-specific antigen (PSA) blood tests as part of routine cancer screening. Talk to your health care provider about prostate cancer screening.  Testicular cancer screening is not recommended for adolescents or adult males who have no symptoms. Screening includes self-exam, a health care provider exam, and other screening tests. Consult with your health care provider about any symptoms you have or any concerns you have about testicular cancer.  Practice safe sex. Use condoms and avoid high-risk sexual practices to reduce the spread of sexually transmitted infections (STIs).  You should be screened for STIs, including gonorrhea and chlamydia if:  You are sexually active and are younger than 24 years.  You are older than 24 years, and your health care provider tells  you that you are at risk for this type of infection.  Your sexual activity has changed since you were last screened, and you are at an increased risk for chlamydia or gonorrhea. Ask your health care provider if you are at risk.  If you are at risk of being infected with HIV, it is recommended that you take a prescription medicine daily to prevent HIV infection. This is called pre-exposure prophylaxis (PrEP). You are considered at risk if:  You are a man who has sex with other men (MSM).  You are a heterosexual man who is sexually active with multiple partners.  You take drugs by injection.  You are sexually active with a partner who has HIV.  Talk with your health care provider about whether you are at high risk of being infected with HIV. If you choose to begin PrEP, you should first be tested for HIV. You should then be tested every 3 months for as long as you are taking PrEP.  Use sunscreen. Apply  sunscreen liberally and repeatedly throughout the day. You should seek shade when your shadow is shorter than you. Protect yourself by wearing long sleeves, pants, a wide-brimmed hat, and sunglasses year round whenever you are outdoors.  Tell your health care provider of new moles or changes in moles, especially if there is a change in shape or color. Also, tell your health care provider if a mole is larger than the size of a pencil eraser.  A one-time screening for abdominal aortic aneurysm (AAA) and surgical repair of large AAAs by ultrasound is recommended for men aged 62-75 years who are current or former smokers.  Stay current with your vaccines (immunizations). Document Released: 12/19/2007 Document Revised: 06/27/2013 Document Reviewed: 11/17/2010 [facility] Patient Information 2015 [place], [place]. This information is not intended to replace advice given to you by your health care provider. Make sure you discuss any questions you have with your health care provider.

## 2014-12-28 ENCOUNTER — Telehealth: Payer: Self-pay | Admitting: Family

## 2014-12-28 NOTE — Telephone Encounter (Signed)
Returned pt's call.   LVM for pt to call back.  

## 2014-12-28 NOTE — Telephone Encounter (Signed)
Pt called in and needs medical clearance from Brandon Colon.  Pt is having surgery July 5th, he is having a knee replacement.  Have you seen anything for [facility] ortho?

## 2014-12-28 NOTE — Telephone Encounter (Signed)
I have not seen any paperwork for Brandon Colon.

## 2014-12-31 ENCOUNTER — Other Ambulatory Visit (HOSPITAL_COMMUNITY): Payer: Self-pay | Admitting: *Deleted

## 2014-12-31 NOTE — Patient Instructions (Addendum)
YOUR PROCEDURE IS SCHEDULED ON : 01/08/15  REPORT TO [facility] ENTRANCE FOLLOW SIGNS TO EAST ELEVATOR - GO TO 3rd FLOOR CHECK IN AT 3 EAST NURSES STATION (SHORT STAY) AT:  11:15 AM  CALL THIS NUMBER IF YOU HAVE PROBLEMS THE MORNING OF SURGERY [phone]  REMEMBER:ONLY 1 PER PERSON MAY GO TO SHORT STAY WITH YOU TO GET READY THE MORNING OF YOUR SURGERY  DO NOT EAT FOOD  AFTER MIDNIGHT  MAY HAVE CLEAR LIQUIDS UNTIL 8:15 AM  TAKE THESE MEDICINES THE MORNING OF SURGERY: NONE   CLEAR LIQUID DIET   Foods Allowed                                                                     Foods Excluded  Coffee and tea, regular and decaf                             liquids that you cannot  Plain Jell-O in any flavor                                             see through such as: Fruit ices (not with fruit pulp)                                     milk, soups, orange juice  Iced Popsicles                                                 All solid food Carbonated beverages, regular and diet                                    Cranberry, grape and apple juices Sports drinks like Gatorade Lightly seasoned clear broth or consume(fat free) Sugar, honey syrup   _____________________________________________________________________  YOU MAY NOT HAVE ANY METAL ON YOUR BODY INCLUDING HAIR PINS AND PIERCING'S. DO NOT WEAR JEWELRY, MAKEUP, LOTIONS, POWDERS OR PERFUMES. DO NOT WEAR NAIL POLISH. DO NOT SHAVE 48 HRS PRIOR TO SURGERY. MEN MAY SHAVE FACE AND NECK.  DO NOT [name] IS NOT RESPONSIBLE FOR VALUABLES.  CONTACTS, DENTURES OR PARTIALS MAY NOT BE WORN TO SURGERY. LEAVE SUITCASE IN CAR. CAN BE BROUGHT TO ROOM AFTER SURGERY.  PATIENTS DISCHARGED THE DAY OF SURGERY WILL NOT BE ALLOWED TO DRIVE HOME.  PLEASE READ OVER THE FOLLOWING INSTRUCTION SHEETS _________________________________________________________________________________                                  [facility] - PREPARING FOR SURGERY  Before surgery, you can play an important role.  Because skin is not sterile, your skin needs to be as free of germs as possible.  You can reduce the number of germs on your skin by washing with CHG (chlorahexidine gluconate) soap before surgery.  CHG is an antiseptic cleaner which kills germs and bonds with the skin to continue killing germs even after washing. Please DO NOT use if you have an allergy to CHG or antibacterial soaps.  If your skin becomes reddened/irritated stop using the CHG and inform your nurse when you arrive at Short Stay. Do not shave (including legs and underarms) for at least 48 hours prior to the first CHG shower.  You may shave your face. Please follow these instructions carefully:   1.  Shower with CHG Soap the night before surgery and the  morning of Surgery.   2.  If you choose to wash your hair, wash your hair first as usual with your  normal  Shampoo.   3.  After you shampoo, rinse your hair and body thoroughly to remove the  shampoo.                                         4.  Use CHG as you would any other liquid soap.  You can apply chg directly  to the skin and wash . Gently wash with scrungie or clean wascloth    5.  Apply the CHG Soap to your body ONLY FROM THE NECK DOWN.   Do not use on open                           Wound or open sores. Avoid contact with eyes, ears mouth and genitals (private parts).                        Genitals (private parts) with your normal soap.              6.  Wash thoroughly, paying special attention to the area where your surgery  will be performed.   7.  Thoroughly rinse your body with warm water from the neck down.   8.  DO NOT shower/wash with your normal soap after using and rinsing off  the CHG Soap .                9.  Pat yourself dry with a clean towel.             10.  Wear clean night clothes to bed after shower             11.  Place clean  sheets on your bed the night of your first shower and do not  sleep with pets.  Day of Surgery : Do not apply any lotions/deodorants the morning of surgery.  Please wear clean clothes to the hospital/surgery center.  FAILURE TO FOLLOW THESE INSTRUCTIONS MAY RESULT IN THE CANCELLATION OF YOUR SURGERY    PATIENT SIGNATURE_________________________________  ______________________________________________________________________     Brandon Colon  An incentive spirometer is a tool that can help keep your lungs clear and active. This tool measures how well you are filling your lungs with each breath. Taking long deep breaths may help reverse or decrease the chance of developing breathing (pulmonary) problems (especially infection) following:  A long period of time when you are unable to move or be active. BEFORE THE PROCEDURE   If the spirometer includes an indicator to show your  best effort, your nurse or respiratory therapist will set it to a desired goal.  If possible, sit up straight or lean slightly forward. Try not to slouch.  Hold the incentive spirometer in an upright position. INSTRUCTIONS FOR USE   Sit on the edge of your bed if possible, or sit up as far as you can in bed or on a chair.  Hold the incentive spirometer in an upright position.  Breathe out normally.  Place the mouthpiece in your mouth and seal your lips tightly around it.  Breathe in slowly and as deeply as possible, raising the piston or the ball toward the top of the column.  Hold your breath for 3-5 seconds or for as long as possible. Allow the piston or ball to fall to the bottom of the column.  Remove the mouthpiece from your mouth and breathe out normally.  Rest for a few seconds and repeat Steps 1 through 7 at least 10 times every 1-2 hours when you are awake. Take your time and take a few normal breaths between deep breaths.  The spirometer may include an indicator to show your best  effort. Use the indicator as a goal to work toward during each repetition.  After each set of 10 deep breaths, practice coughing to be sure your lungs are clear. If you have an incision (the cut made at the time of surgery), support your incision when coughing by placing a pillow or rolled up towels firmly against it. Once you are able to get out of bed, walk around indoors and cough well. You may stop using the incentive spirometer when instructed by your caregiver.  RISKS AND COMPLICATIONS  Take your time so you do not get dizzy or light-headed.  If you are in pain, you may need to take or ask for pain medication before doing incentive spirometry. It is harder to take a deep breath if you are having pain. AFTER USE  Rest and breathe slowly and easily.  It can be helpful to keep track of a log of your progress. Your caregiver can provide you with a simple table to help with this. If you are using the spirometer at home, follow these instructions: [place] IF:   You are having difficultly using the spirometer.  You have trouble using the spirometer as often as instructed.  Your pain medication is not giving enough relief while using the spirometer.  You develop fever of 100.5 F (38.1 C) or higher. SEEK IMMEDIATE MEDICAL CARE IF:   You cough up bloody sputum that had not been present before.  You develop fever of 102 F (38.9 C) or greater.  You develop worsening pain at or near the incision site. MAKE SURE YOU:   Understand these instructions.  Will watch your condition.  Will get help right away if you are not doing well or get worse. Document Released: 11/02/2006 Document Revised: 09/14/2011 Document Reviewed: 01/03/2007 Exit[employer] Patient Information 2014 Exit[employer], [place].   ________________________________________________________________________  WHAT IS A BLOOD TRANSFUSION? Blood Transfusion Information  A transfusion is the replacement of blood or some of  its parts. Blood is made up of multiple cells which provide different functions.  Red blood cells carry oxygen and are used for blood loss replacement.  White blood cells fight against infection.  Platelets control bleeding.  Plasma helps clot blood.  Other blood products are available for specialized needs, such as hemophilia or other clotting disorders. BEFORE THE TRANSFUSION  Who gives blood for  transfusions?   Healthy volunteers who are fully evaluated to make sure their blood is safe. This is blood bank blood. Transfusion therapy is the safest it has ever been in the practice of medicine. Before blood is taken from a donor, a complete history is taken to make sure that person has no history of diseases nor engages in risky social behavior (examples are intravenous drug use or sexual activity with multiple partners). The donor's travel history is screened to minimize risk of transmitting infections, such as malaria. The donated blood is tested for signs of infectious diseases, such as HIV and hepatitis. The blood is then tested to be sure it is compatible with you in order to minimize the chance of a transfusion reaction. If you or a relative donates blood, this is often done in anticipation of surgery and is not appropriate for emergency situations. It takes many days to process the donated blood. RISKS AND COMPLICATIONS Although transfusion therapy is very safe and saves many lives, the main dangers of transfusion include:   Getting an infectious disease.  Developing a transfusion reaction. This is an allergic reaction to something in the blood you were given. Every precaution is taken to prevent this. The decision to have a blood transfusion has been considered carefully by your caregiver before blood is given. Blood is not given unless the benefits outweigh the risks. AFTER THE TRANSFUSION  Right after receiving a blood transfusion, you will usually feel much better and more  energetic. This is especially true if your red blood cells have gotten low (anemic). The transfusion raises the level of the red blood cells which carry oxygen, and this usually causes an energy increase.  The nurse administering the transfusion will monitor you carefully for complications. HOME CARE INSTRUCTIONS  No special instructions are needed after a transfusion. You may find your energy is better. Speak with your caregiver about any limitations on activity for underlying diseases you may have. SEEK MEDICAL CARE IF:   Your condition is not improving after your transfusion.  You develop redness or irritation at the intravenous (IV) site. SEEK IMMEDIATE MEDICAL CARE IF:  Any of the following symptoms occur over the next 12 hours:  Shaking chills.  You have a temperature by mouth above 102 F (38.9 C), not controlled by medicine.  Chest, back, or muscle pain.  People around you feel you are not acting correctly or are confused.  Shortness of breath or difficulty breathing.  Dizziness and fainting.  You get a rash or develop hives.  You have a decrease in urine output.  Your urine turns a dark color or changes to pink, red, or brown. Any of the following symptoms occur over the next 10 days:  You have a temperature by mouth above 102 F (38.9 C), not controlled by medicine.  Shortness of breath.  Weakness after normal activity.  The white part of the eye turns yellow (jaundice).  You have a decrease in the amount of urine or are urinating less often.  Your urine turns a dark color or changes to pink, red, or brown. Document Released: 06/19/2000 Document Revised: 09/14/2011 Document Reviewed: 02/06/2008 [facility] Patient Information 2014 [place], [place].  _______________________________________________________________________

## 2015-01-01 ENCOUNTER — Encounter (HOSPITAL_COMMUNITY)
Admission: RE | Admit: 2015-01-01 | Discharge: 2015-01-01 | Disposition: A | Discharge status: Home / Self Care | Payer: BC Managed Care – PPO | Source: Ambulatory Visit | Attending: Orthopedic Surgery | Admitting: Orthopedic Surgery

## 2015-01-01 ENCOUNTER — Encounter (HOSPITAL_COMMUNITY): Payer: Self-pay

## 2015-01-01 ENCOUNTER — Telehealth: Payer: Self-pay

## 2015-01-01 DIAGNOSIS — I10 Essential (primary) hypertension: Secondary | ICD-10-CM | POA: Diagnosis not present

## 2015-01-01 DIAGNOSIS — Z01810 Encounter for preprocedural cardiovascular examination: Secondary | ICD-10-CM | POA: Insufficient documentation

## 2015-01-01 HISTORY — DX: Idiopathic aseptic necrosis of unspecified bone: M87.00

## 2015-01-01 HISTORY — DX: Unspecified osteoarthritis, unspecified site: M19.90

## 2015-01-01 HISTORY — DX: Sleep disorder, unspecified: G47.9

## 2015-01-01 LAB — BASIC METABOLIC PANEL
Anion gap: 10 (ref 5–15)
BUN: 8 mg/dL (ref 6–20)
CO2: 26 mmol/L (ref 22–32)
CREATININE: 0.68 mg/dL (ref 0.61–1.24)
Calcium: 9.2 mg/dL (ref 8.9–10.3)
Chloride: 104 mmol/L (ref 101–111)
GFR calc Af Amer: >60 mL/min (ref >60)
Glucose, Bld: 91 mg/dL (ref 65–99)
Potassium: 3.5 mmol/L (ref 3.5–5.1)
SODIUM: 140 mmol/L (ref 135–145)

## 2015-01-01 LAB — SURGICAL PCR SCREEN
MRSA, PCR: INVALID — AB
Staphylococcus aureus: INVALID — AB

## 2015-01-01 LAB — PROTIME-INR
INR: 1.03 (ref 0.00–1.49)
Prothrombin Time: 13.7 seconds (ref 11.6–15.2)

## 2015-01-01 LAB — CBC
HCT: 41.6 % (ref 39.0–52.0)
Hemoglobin: 14.2 g/dL (ref 13.0–17.0)
MCH: 29.2 pg (ref 26.0–34.0)
MCHC: 34.1 g/dL (ref 30.0–36.0)
MCV: 85.6 fL (ref 78.0–100.0)
Platelets: 151 10*3/uL (ref 150–400)
RBC: 4.86 MIL/uL (ref 4.22–5.81)
RDW: 12.6 % (ref 11.5–15.5)
WBC: 4.7 10*3/uL (ref 4.0–10.5)

## 2015-01-01 LAB — APTT: aPTT: 30 seconds (ref 24–37)

## 2015-01-01 NOTE — Progress Notes (Signed)
Dr. Delma Post contacted concerning elevated BP - no action needed at this time - will be rechecked on day of surgery

## 2015-01-01 NOTE — Progress Notes (Signed)
   01/01/15 0920  OBSTRUCTIVE SLEEP APNEA  Have you ever been diagnosed with sleep apnea through a sleep study? No  Do you snore loudly (loud enough to be heard through closed doors)?  1  Do you often feel tired, fatigued, or sleepy during the daytime? 0  Has anyone observed you stop breathing during your sleep? 0  Do you have, or are you being treated for high blood pressure? 1  BMI more than 35 kg/m2? 0  Age over 59 years old? 1  Neck circumference greater than 40 cm/16 inches? 1  Gender: 1

## 2015-01-01 NOTE — Telephone Encounter (Signed)
i have faxed over pre-op clearance to [phone] per request from [facility]

## 2015-01-03 LAB — MRSA CULTURE

## 2015-01-03 NOTE — H&P (Signed)
TOTAL KNEE ADMISSION H&P  Patient is being admitted for left total knee arthroplasty.  Subjective:  Chief Complaint:     Left knee AVN / pain.  HPI: Brandon Colon, 58 y.o. male, has a history of pain and functional disability in the left knee due to arthritis and AVN and has failed non-surgical conservative treatments for greater than 12 weeks to include NSAID's and/or analgesics, corticosteriod injections, use of assistive devices and activity modification.  Onset of symptoms was gradual, starting ~1 years ago with gradually worsening course since that time. The patient noted no past surgery on the left knee(s).  Patient currently rates pain in the left knee(s) at 10 out of 10 with activity. Patient has worsening of pain with activity and weight bearing, pain that interferes with activities of daily living, pain with passive range of motion, crepitus and joint swelling.  Patient has evidence of periarticular osteophytes and joint space narrowing by imaging studies. There is no active infection.  Risks, benefits and expectations were discussed with the patient.  Risks including but not limited to the risk of anesthesia, blood clots, nerve damage, blood vessel damage, failure of the prosthesis, infection and up to and including death.  Patient understand the risks, benefits and expectations and wishes to proceed with surgery.   PCP: Mauricio Po, [name]  D/C Plans:      Home with HHPT  Post-op Meds:       No Rx given   Tranexamic Acid:      To be given - topically (TIA 2010)  Decadron:      Is to be given  FYI:     ASA post-op  Norco post-op    Patient Active Problem List   Diagnosis Date Noted  . Routine general medical examination at a health care facility 11/21/2014   Past Medical History  Diagnosis Date  . HTN (hypertension)   . Arthritis   . Avascular necrosis     LEFT KNEE  . Difficulty sleeping     DUE TO KNEE PAIN    Past Surgical History  Procedure Laterality Date  .  Finger surgery  1989    RT INDEX FINGER    No prescriptions prior to admission   No Known Allergies   History  Substance Use Topics  . Smoking status: Never Smoker   . Smokeless tobacco: Never Used  . Alcohol Use: Yes     Comment: occasionally    Family History  Problem Relation Age of Onset  . Heart disease Mother   . Hypertension Mother   . Heart disease Father   . Hypertension Father   . Hypertension Maternal Grandmother   . Hypertension Maternal Grandfather   . Hypertension Paternal Grandmother   . Hypertension Paternal Grandfather      Review of Systems  Constitutional: Negative.   HENT: Negative.   Eyes: Negative.   Respiratory: Negative.   Cardiovascular: Negative.   Gastrointestinal: Negative.   Genitourinary: Negative.   Musculoskeletal: Positive for joint pain.  Skin: Negative.   Neurological: Negative.   Endo/Heme/Allergies: Negative.   Psychiatric/Behavioral: Negative.     Objective:  Physical Exam  Constitutional: He is oriented to person, place, and time. He appears well-developed.  HENT:  Head: Normocephalic.  Eyes: Pupils are equal, round, and reactive to light.  Neck: Neck supple. No JVD present. No tracheal deviation present. No thyromegaly present.  Cardiovascular: Normal rate, regular rhythm, normal heart sounds and intact distal pulses.   Respiratory: Effort normal and breath  sounds normal. No stridor. No respiratory distress. He has no wheezes.  GI: Soft. There is no tenderness. There is no guarding.  Musculoskeletal:       Left knee: He exhibits decreased range of motion, swelling and bony tenderness. He exhibits no ecchymosis, no deformity, no laceration and no erythema. Tenderness found.  Lymphadenopathy:    He has no cervical adenopathy.  Neurological: He is alert and oriented to person, place, and time.  Skin: Skin is warm and dry.  Psychiatric: He has a normal mood and affect.      Labs:  Estimated body mass index is 29.29  kg/(m^2) as calculated from the following:   Height as of 11/21/14: 5' 9.5" (1.765 m).   Weight as of 11/21/14: 91.23 kg (201 lb 2 oz).   Imaging Review Plain radiographs demonstrate severe degenerative joint disease of the left knee(s). The overall alignment is neutral. The bone quality appears to be good for age and reported activity level.  MRI revealed AVN.  Assessment/Plan:  AVN / pain, left knee   The patient history, physical examination, clinical judgment of the provider and imaging studies are consistent with end stage degenerative joint disease of the left knee(s) and total knee arthroplasty is deemed medically necessary. The treatment options including medical management, injection therapy arthroscopy and arthroplasty were discussed at length. The risks and benefits of total knee arthroplasty were presented and reviewed. The risks due to aseptic loosening, infection, stiffness, patella tracking problems, thromboembolic complications and other imponderables were discussed. The patient acknowledged the explanation, agreed to proceed with the plan and consent was signed. Patient is being admitted for inpatient treatment for surgery, pain control, PT, OT, prophylactic antibiotics, VTE prophylaxis, progressive ambulation and ADL's and discharge planning. The patient is planning to be discharged home with home health services.      [place]   PA-C  01/03/2015, 12:37 PM

## 2015-01-08 ENCOUNTER — Other Ambulatory Visit: Payer: Self-pay

## 2015-01-08 ENCOUNTER — Encounter (HOSPITAL_COMMUNITY)
Admission: RE | Disposition: A | Discharge status: Home Health | Payer: Self-pay | Source: Ambulatory Visit | Attending: Orthopedic Surgery

## 2015-01-08 ENCOUNTER — Inpatient Hospital Stay (HOSPITAL_COMMUNITY): Payer: BC Managed Care – PPO | Admitting: Anesthesiology

## 2015-01-08 ENCOUNTER — Inpatient Hospital Stay (HOSPITAL_COMMUNITY)
Admission: RE | Admit: 2015-01-08 | Discharge: 2015-01-09 | DRG: 470 | Disposition: A | Discharge status: Home Health | Payer: BC Managed Care – PPO | Source: Ambulatory Visit | Attending: Orthopedic Surgery | Admitting: Orthopedic Surgery

## 2015-01-08 ENCOUNTER — Encounter (HOSPITAL_COMMUNITY): Payer: Self-pay | Admitting: *Deleted

## 2015-01-08 DIAGNOSIS — M25562 Pain in left knee: Secondary | ICD-10-CM | POA: Diagnosis present

## 2015-01-08 DIAGNOSIS — Z96659 Presence of unspecified artificial knee joint: Secondary | ICD-10-CM

## 2015-01-08 DIAGNOSIS — I10 Essential (primary) hypertension: Secondary | ICD-10-CM | POA: Diagnosis present

## 2015-01-08 DIAGNOSIS — Z6830 Body mass index (BMI) 30.0-30.9, adult: Secondary | ICD-10-CM | POA: Diagnosis not present

## 2015-01-08 DIAGNOSIS — M1712 Unilateral primary osteoarthritis, left knee: Principal | ICD-10-CM | POA: Diagnosis present

## 2015-01-08 DIAGNOSIS — M8788 Other osteonecrosis, other site: Secondary | ICD-10-CM | POA: Diagnosis present

## 2015-01-08 DIAGNOSIS — E669 Obesity, unspecified: Secondary | ICD-10-CM | POA: Diagnosis present

## 2015-01-08 DIAGNOSIS — Z96652 Presence of left artificial knee joint: Secondary | ICD-10-CM

## 2015-01-08 HISTORY — PX: TOTAL KNEE ARTHROPLASTY: SHX125

## 2015-01-08 LAB — TYPE AND SCREEN
ABO/RH(D): O POS
Antibody Screen: NEGATIVE

## 2015-01-08 LAB — ABO/RH: ABO/RH(D): O POS

## 2015-01-08 SURGERY — ARTHROPLASTY, KNEE, TOTAL
Anesthesia: Spinal | Site: Knee | Laterality: Left

## 2015-01-08 MED ORDER — PROPOFOL 10 MG/ML IV BOLUS
INTRAVENOUS | Status: AC
  Filled 2015-01-08: qty 20

## 2015-01-08 MED ORDER — SODIUM CHLORIDE 0.9 % IJ SOLN
INTRAMUSCULAR | Status: AC
  Filled 2015-01-08: qty 50

## 2015-01-08 MED ORDER — SODIUM CHLORIDE 0.9 % IJ SOLN
INTRAMUSCULAR | Status: DC | PRN
  Administered 2015-01-08: 30 mL via INTRAVENOUS

## 2015-01-08 MED ORDER — PROPOFOL 10 MG/ML IV BOLUS
INTRAVENOUS | Status: DC | PRN
  Administered 2015-01-08 (×3): 10 mg via INTRAVENOUS

## 2015-01-08 MED ORDER — KETOROLAC TROMETHAMINE 30 MG/ML IJ SOLN
INTRAMUSCULAR | Status: AC
  Filled 2015-01-08: qty 1

## 2015-01-08 MED ORDER — ONDANSETRON HCL 4 MG/2ML IJ SOLN
INTRAMUSCULAR | Status: DC | PRN
Start: 2015-01-08 — End: 2015-01-08
  Administered 2015-01-08: 4 mg via INTRAVENOUS

## 2015-01-08 MED ORDER — CEFAZOLIN SODIUM-DEXTROSE 2-3 GM-% IV SOLR
2.0000 g | Freq: Four times a day (QID) | INTRAVENOUS | Status: AC
  Administered 2015-01-08 (×2): 2 g via INTRAVENOUS
  Filled 2015-01-08 (×2): qty 50

## 2015-01-08 MED ORDER — SODIUM CHLORIDE 0.9 % IV SOLN
INTRAVENOUS | Status: DC
  Administered 2015-01-08 – 2015-01-09 (×2): via INTRAVENOUS
  Filled 2015-01-08 (×10): qty 1000

## 2015-01-08 MED ORDER — KETOROLAC TROMETHAMINE 30 MG/ML IJ SOLN
INTRAMUSCULAR | Status: DC | PRN
  Administered 2015-01-08: 30 mg via INTRAVENOUS

## 2015-01-08 MED ORDER — ONDANSETRON HCL 4 MG PO TABS: 4.0000 mg | ORAL_TABLET | Freq: Four times a day (QID) | ORAL | Status: DC | PRN

## 2015-01-08 MED ORDER — BUPIVACAINE-EPINEPHRINE (PF) 0.25% -1:200000 IJ SOLN
INTRAMUSCULAR | Status: DC | PRN
  Administered 2015-01-08: 30 mL via PERINEURAL

## 2015-01-08 MED ORDER — CEFAZOLIN SODIUM-DEXTROSE 2-3 GM-% IV SOLR
2.0000 g | INTRAVENOUS | Status: AC
  Administered 2015-01-08: 2 g via INTRAVENOUS

## 2015-01-08 MED ORDER — POLYETHYLENE GLYCOL 3350 17 G PO PACK
17.0000 g | PACK | Freq: Two times a day (BID) | ORAL | Status: DC
  Administered 2015-01-09: 17 g via ORAL

## 2015-01-08 MED ORDER — CEFAZOLIN SODIUM-DEXTROSE 2-3 GM-% IV SOLR
INTRAVENOUS | Status: AC
  Filled 2015-01-08: qty 50

## 2015-01-08 MED ORDER — 0.9 % SODIUM CHLORIDE (POUR BTL) OPTIME
TOPICAL | Status: DC | PRN
  Administered 2015-01-08: 1000 mL

## 2015-01-08 MED ORDER — ONDANSETRON HCL 4 MG/2ML IJ SOLN: 4.0000 mg | Freq: Once | INTRAMUSCULAR | Status: DC | PRN

## 2015-01-08 MED ORDER — METHOCARBAMOL 500 MG PO TABS
500.0000 mg | ORAL_TABLET | Freq: Four times a day (QID) | ORAL | Status: DC | PRN
  Administered 2015-01-08 – 2015-01-09 (×2): 500 mg via ORAL
  Filled 2015-01-08 (×2): qty 1

## 2015-01-08 MED ORDER — BUPIVACAINE-EPINEPHRINE (PF) 0.25% -1:200000 IJ SOLN
INTRAMUSCULAR | Status: AC
  Filled 2015-01-08: qty 30

## 2015-01-08 MED ORDER — DOCUSATE SODIUM 100 MG PO CAPS
100.0000 mg | ORAL_CAPSULE | Freq: Two times a day (BID) | ORAL | Status: DC
  Administered 2015-01-08 – 2015-01-09 (×2): 100 mg via ORAL

## 2015-01-08 MED ORDER — MIDAZOLAM HCL 2 MG/2ML IJ SOLN
INTRAMUSCULAR | Status: AC
  Filled 2015-01-08: qty 2

## 2015-01-08 MED ORDER — DEXAMETHASONE SODIUM PHOSPHATE 10 MG/ML IJ SOLN
10.0000 mg | Freq: Once | INTRAMUSCULAR | Status: AC
  Administered 2015-01-08: 10 mg via INTRAVENOUS

## 2015-01-08 MED ORDER — LIDOCAINE HCL (CARDIAC) 20 MG/ML IV SOLN
INTRAVENOUS | Status: DC | PRN
  Administered 2015-01-08: 50 mg via INTRAVENOUS

## 2015-01-08 MED ORDER — LIDOCAINE HCL (CARDIAC) 20 MG/ML IV SOLN
INTRAVENOUS | Status: AC
  Filled 2015-01-08: qty 5

## 2015-01-08 MED ORDER — PROPOFOL INFUSION 10 MG/ML OPTIME
INTRAVENOUS | Status: DC | PRN
  Administered 2015-01-08: 100 ug/kg/min via INTRAVENOUS

## 2015-01-08 MED ORDER — SODIUM CHLORIDE 0.9 % IR SOLN
Status: DC | PRN
  Administered 2015-01-08: 1000 mL

## 2015-01-08 MED ORDER — METOCLOPRAMIDE HCL 10 MG PO TABS: 5.0000 mg | ORAL_TABLET | Freq: Three times a day (TID) | ORAL | Status: DC | PRN

## 2015-01-08 MED ORDER — CLONIDINE HCL 0.2 MG PO TABS
0.2000 mg | ORAL_TABLET | Freq: Every day | ORAL | Status: DC
  Administered 2015-01-08: 0.2 mg via ORAL
  Filled 2015-01-08 (×2): qty 1

## 2015-01-08 MED ORDER — DEXAMETHASONE SODIUM PHOSPHATE 10 MG/ML IJ SOLN
10.0000 mg | Freq: Once | INTRAMUSCULAR | Status: AC
  Administered 2015-01-09: 10 mg via INTRAVENOUS
  Filled 2015-01-08: qty 1

## 2015-01-08 MED ORDER — MAGNESIUM CITRATE PO SOLN: 1.0000 | Freq: Once | ORAL | Status: AC | PRN

## 2015-01-08 MED ORDER — CELECOXIB 200 MG PO CAPS
200.0000 mg | ORAL_CAPSULE | Freq: Two times a day (BID) | ORAL | Status: DC
  Administered 2015-01-08 – 2015-01-09 (×2): 200 mg via ORAL
  Filled 2015-01-08 (×4): qty 1

## 2015-01-08 MED ORDER — ASPIRIN EC 325 MG PO TBEC
325.0000 mg | DELAYED_RELEASE_TABLET | Freq: Two times a day (BID) | ORAL | Status: DC
  Administered 2015-01-09: 325 mg via ORAL
  Filled 2015-01-08 (×3): qty 1

## 2015-01-08 MED ORDER — DIPHENHYDRAMINE HCL 25 MG PO CAPS: 25.0000 mg | ORAL_CAPSULE | Freq: Four times a day (QID) | ORAL | Status: DC | PRN

## 2015-01-08 MED ORDER — FENTANYL CITRATE (PF) 100 MCG/2ML IJ SOLN
INTRAMUSCULAR | Status: DC | PRN
  Administered 2015-01-08: 25 ug via INTRAVENOUS
  Administered 2015-01-08: 50 ug via INTRAVENOUS
  Administered 2015-01-08: 25 ug via INTRAVENOUS

## 2015-01-08 MED ORDER — HYDROCODONE-ACETAMINOPHEN 7.5-325 MG PO TABS
1.0000 | ORAL_TABLET | ORAL | Status: DC
  Administered 2015-01-08: 1 via ORAL
  Administered 2015-01-08: 2 via ORAL
  Administered 2015-01-08: 1 via ORAL
  Administered 2015-01-09 (×4): 2 via ORAL
  Filled 2015-01-08 (×2): qty 2
  Filled 2015-01-08: qty 1
  Filled 2015-01-08: qty 2
  Filled 2015-01-08: qty 1
  Filled 2015-01-08 (×2): qty 2

## 2015-01-08 MED ORDER — MENTHOL 3 MG MT LOZG: 1.0000 | LOZENGE | OROMUCOSAL | Status: DC | PRN

## 2015-01-08 MED ORDER — HYDROMORPHONE HCL 1 MG/ML IJ SOLN
0.5000 mg | INTRAMUSCULAR | Status: DC | PRN
  Administered 2015-01-08: 1 mg via INTRAVENOUS
  Filled 2015-01-08: qty 1

## 2015-01-08 MED ORDER — ONDANSETRON HCL 4 MG/2ML IJ SOLN: 4.0000 mg | Freq: Four times a day (QID) | INTRAMUSCULAR | Status: DC | PRN

## 2015-01-08 MED ORDER — LACTATED RINGERS IV SOLN
INTRAVENOUS | Status: DC
  Administered 2015-01-08: 1000 mL via INTRAVENOUS
  Administered 2015-01-08 (×2): via INTRAVENOUS

## 2015-01-08 MED ORDER — SODIUM CHLORIDE 0.9 % IV SOLN
2000.0000 mg | Freq: Once | INTRAVENOUS | Status: DC
  Filled 2015-01-08: qty 20

## 2015-01-08 MED ORDER — PHENOL 1.4 % MT LIQD: 1.0000 | OROMUCOSAL | Status: DC | PRN

## 2015-01-08 MED ORDER — METHOCARBAMOL 1000 MG/10ML IJ SOLN
500.0000 mg | Freq: Four times a day (QID) | INTRAVENOUS | Status: DC | PRN
  Filled 2015-01-08: qty 5

## 2015-01-08 MED ORDER — FERROUS SULFATE 325 (65 FE) MG PO TABS
325.0000 mg | ORAL_TABLET | Freq: Three times a day (TID) | ORAL | Status: DC
  Administered 2015-01-09 (×2): 325 mg via ORAL
  Filled 2015-01-08 (×5): qty 1

## 2015-01-08 MED ORDER — FENTANYL CITRATE (PF) 100 MCG/2ML IJ SOLN: 25.0000 ug | INTRAMUSCULAR | Status: DC | PRN

## 2015-01-08 MED ORDER — BISACODYL 10 MG RE SUPP: 10.0000 mg | Freq: Every day | RECTAL | Status: DC | PRN

## 2015-01-08 MED ORDER — MIDAZOLAM HCL 5 MG/5ML IJ SOLN
INTRAMUSCULAR | Status: DC | PRN
  Administered 2015-01-08: 2 mg via INTRAVENOUS

## 2015-01-08 MED ORDER — METOCLOPRAMIDE HCL 5 MG/ML IJ SOLN: 5.0000 mg | Freq: Three times a day (TID) | INTRAMUSCULAR | Status: DC | PRN

## 2015-01-08 MED ORDER — FENTANYL CITRATE (PF) 100 MCG/2ML IJ SOLN
INTRAMUSCULAR | Status: AC
  Filled 2015-01-08: qty 2

## 2015-01-08 MED ORDER — CHLORHEXIDINE GLUCONATE 4 % EX LIQD: 60.0000 mL | Freq: Once | CUTANEOUS | Status: DC

## 2015-01-08 MED ORDER — ALUM & MAG HYDROXIDE-SIMETH 200-200-20 MG/5ML PO SUSP: 30.0000 mL | ORAL | Status: DC | PRN

## 2015-01-08 SURGICAL SUPPLY — 54 items
BAG DECANTER FOR FLEXI CONT (MISCELLANEOUS) IMPLANT
BAG ZIPLOCK 12X15 (MISCELLANEOUS) IMPLANT
BANDAGE ELASTIC 6 VELCRO ST LF (GAUZE/BANDAGES/DRESSINGS) ×2 IMPLANT
BANDAGE ESMARK 6X9 LF (GAUZE/BANDAGES/DRESSINGS) ×1 IMPLANT
BLADE SAW SGTL 13.0X1.19X90.0M (BLADE) ×2 IMPLANT
BNDG ELASTIC 6X10 VLCR STRL LF (GAUZE/BANDAGES/DRESSINGS) ×2 IMPLANT
BNDG ESMARK 6X9 LF (GAUZE/BANDAGES/DRESSINGS) ×2
BOWL SMART MIX CTS (DISPOSABLE) ×2 IMPLANT
CAPT KNEE TOTAL 3 ATTUNE ×2 IMPLANT
CEMENT HV SMART SET (Cement) ×4 IMPLANT
CUFF TOURN SGL QUICK 34 (TOURNIQUET CUFF) ×1
CUFF TRNQT CYL 34X4X40X1 (TOURNIQUET CUFF) ×1 IMPLANT
DECANTER SPIKE VIAL GLASS SM (MISCELLANEOUS) ×2 IMPLANT
DRAPE EXTREMITY T 121X128X90 (DRAPE) ×2 IMPLANT
DRAPE POUCH INSTRU U-SHP 10X18 (DRAPES) ×2 IMPLANT
DRAPE U-SHAPE 47X51 STRL (DRAPES) ×2 IMPLANT
DRSG AQUACEL AG ADV 3.5X10 (GAUZE/BANDAGES/DRESSINGS) ×2 IMPLANT
DURAPREP 26ML APPLICATOR (WOUND CARE) ×4 IMPLANT
ELECT REM PT RETURN 9FT ADLT (ELECTROSURGICAL) ×2
ELECTRODE REM PT RTRN 9FT ADLT (ELECTROSURGICAL) ×1 IMPLANT
FACESHIELD WRAPAROUND (MASK) ×10 IMPLANT
GLOVE BIOGEL PI IND STRL 7.5 (GLOVE) ×1 IMPLANT
GLOVE BIOGEL PI IND STRL 8.5 (GLOVE) ×1 IMPLANT
GLOVE BIOGEL PI INDICATOR 7.5 (GLOVE) ×1
GLOVE BIOGEL PI INDICATOR 8.5 (GLOVE) ×1
GLOVE ECLIPSE 8.0 STRL XLNG CF (GLOVE) ×2 IMPLANT
GLOVE ORTHO TXT STRL SZ7.5 (GLOVE) ×4 IMPLANT
GOWN SPEC L3 XXLG W/TWL (GOWN DISPOSABLE) ×2 IMPLANT
GOWN STRL REUS W/TWL LRG LVL3 (GOWN DISPOSABLE) ×2 IMPLANT
HANDPIECE INTERPULSE COAX TIP (DISPOSABLE) ×1
KIT BASIN OR (CUSTOM PROCEDURE TRAY) ×2 IMPLANT
LIQUID BAND (GAUZE/BANDAGES/DRESSINGS) ×2 IMPLANT
MANIFOLD NEPTUNE II (INSTRUMENTS) ×2 IMPLANT
NDL SAFETY ECLIPSE 18X1.5 (NEEDLE) ×1 IMPLANT
NEEDLE HYPO 18GX1.5 SHARP (NEEDLE) ×1
PACK TOTAL JOINT (CUSTOM PROCEDURE TRAY) ×2 IMPLANT
PEN SKIN MARKING BROAD (MISCELLANEOUS) ×2 IMPLANT
POSITIONER SURGICAL ARM (MISCELLANEOUS) ×2 IMPLANT
SET HNDPC FAN SPRY TIP SCT (DISPOSABLE) ×1 IMPLANT
SET PAD KNEE POSITIONER (MISCELLANEOUS) ×2 IMPLANT
SPONGE LAP 18X18 X RAY DECT (DISPOSABLE) ×2 IMPLANT
SUCTION FRAZIER 12FR DISP (SUCTIONS) ×2 IMPLANT
SUT MNCRL AB 4-0 PS2 18 (SUTURE) ×2 IMPLANT
SUT VIC AB 1 CT1 36 (SUTURE) ×4 IMPLANT
SUT VIC AB 2-0 CT1 27 (SUTURE) ×3
SUT VIC AB 2-0 CT1 TAPERPNT 27 (SUTURE) ×3 IMPLANT
SUT VLOC 180 0 24IN GS25 (SUTURE) ×2 IMPLANT
SYR 50ML LL SCALE MARK (SYRINGE) ×2 IMPLANT
TOWEL OR 17X26 10 PK STRL BLUE (TOWEL DISPOSABLE) ×2 IMPLANT
TOWEL OR NON WOVEN STRL DISP B (DISPOSABLE) IMPLANT
TRAY FOLEY W/METER SILVER 14FR (SET/KITS/TRAYS/PACK) IMPLANT
WATER STERILE IRR 1500ML POUR (IV SOLUTION) ×2 IMPLANT
WRAP KNEE MAXI GEL POST OP (GAUZE/BANDAGES/DRESSINGS) ×2 IMPLANT
YANKAUER SUCT BULB TIP 10FT TU (MISCELLANEOUS) ×2 IMPLANT

## 2015-01-08 NOTE — Anesthesia Preprocedure Evaluation (Addendum)
Anesthesia Evaluation  Patient identified by MRN, date of birth, ID band Patient awake    Reviewed: Allergy & Precautions, NPO status , Patient's Chart, lab work & pertinent test results  History of Anesthesia Complications Negative for: history of anesthetic complications  Airway Mallampati: III  TM Distance: >3 FB Neck ROM: Full    Dental no notable dental hx. (+) Dental Advisory Given, Poor Dentition   Pulmonary neg pulmonary ROS,  breath sounds clear to auscultation  Pulmonary exam normal       Cardiovascular hypertension, Pt. on medications Normal cardiovascular examRhythm:Regular Rate:Normal     Neuro/Psych negative neurological ROS  negative psych ROS   GI/Hepatic negative GI ROS, Neg liver ROS,   Endo/Other  negative endocrine ROS  Renal/GU negative Renal ROS  negative genitourinary   Musculoskeletal  (+) Arthritis -,   Abdominal   Peds negative pediatric ROS (+)  Hematology negative hematology ROS (+)   Anesthesia Other Findings   Reproductive/Obstetrics negative OB ROS                           Anesthesia Physical Anesthesia Plan  ASA: II  Anesthesia Plan: Spinal   Post-op Pain Management:    Induction: Intravenous  Airway Management Planned: Nasal Cannula  Additional Equipment:   Intra-op Plan:   Post-operative Plan:   Informed Consent: I have reviewed the patients History and Physical, chart, labs and discussed the procedure including the risks, benefits and alternatives for the proposed anesthesia with the patient or authorized representative who has indicated his/her understanding and acceptance.   Dental advisory given  Plan Discussed with: CRNA  Anesthesia Plan Comments:        Anesthesia Quick Evaluation

## 2015-01-08 NOTE — Anesthesia Postprocedure Evaluation (Signed)
  Anesthesia Post-op Note  Patient: Brandon Colon  Procedure(s) Performed: Procedure(s) (LRB): TOTAL LEFT  KNEE ARTHROPLASTY (Left)  Anesthesia Type: Spinal  Level of Consciousness: awake and alert   Airway and Oxygen Therapy: Patient Spontanous Breathing  Post-op Pain: mild  Post-op Assessment: Post-op Vital signs reviewed, Patient's Cardiovascular Status Stable, Respiratory Function Stable, Patent Airway and No signs of Nausea or vomiting  Last Vitals:  Filed Vitals:   01/08/15 1842  BP: 143/83  Pulse: 78  Temp: 36.9 C  Resp: 18    Post-op Vital Signs: stable   Complications: No apparent anesthesia complications

## 2015-01-08 NOTE — Interval H&P Note (Signed)
History and Physical Interval Note:  01/08/2015 11:29 AM  Brandon Colon  has presented today for surgery, with the diagnosis of LEFT KNEE AVASCULAR NECROSIS   The various methods of treatment have been discussed with the patient and family. After consideration of risks, benefits and other options for treatment, the patient has consented to  Procedure(s): TOTAL LEFT  KNEE ARTHROPLASTY (Left) as a surgical intervention .  The patient's history has been reviewed, patient examined, no change in status, stable for surgery.  I have reviewed the patient's chart and labs.  Questions were answered to the patient's satisfaction.     Mauri Pole

## 2015-01-08 NOTE — Op Note (Signed)
NAME:  Brandon Colon                      MEDICAL RECORD NO.:  [id number]                             FACILITY:  [facility]      PHYSICIAN:  Pietro Cassis. Brandon Dame, M.D.  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      FINDINGS:  The patient was noted to have complete loss of cartilage and   bone-on-bone arthritis with associated osteophytes medially with avascular necrosis with significant collapse, synovitis and associated effusion.      PROCEDURE:  Left total knee replacement.      COMPONENTS USED:  DePuy Attune rotating platform posterior stabilized knee   system, a size 6 femur, 6 tibia, size 8 mm AOX PS insert, and 38 anatomic patellar   button.      SURGEON:  Pietro Cassis. Brandon Dame, M.D.      ASSISTANT:  Danae Orleans, PA-C.      ANESTHESIA:  Spinal.      SPECIMENS:  None.      COMPLICATION:  None.      DRAINS:  One Hemovac.  EBL: <100cc      TOURNIQUET TIME:   Total Tourniquet Time Documented: Thigh (Left) - 36 minutes Total: Thigh (Left) - 36 minutes  .      The patient was stable to the recovery room.      INDICATION FOR PROCEDURE:  Brandon Colon is a 59 y.o. male patient of   mine.  The patient had been seen, evaluated, and treated conservatively in the   office with medication, activity modification, and injections.  The patient had   radiographic changes of bone-on-bone arthritis with endplate sclerosis and osteophytes noted.      The patient failed conservative measures including medication, injections, and activity modification, and at this point was ready for more definitive measures.   Based on the radiographic changes and failed conservative measures, the  patient   decided to proceed with total knee replacement.  Risks of infection,   DVT, component failure, need for revision surgery, postop course, and   expectations were all   discussed and reviewed.  Consent was obtained for benefit of pain   relief.      PROCEDURE IN DETAIL:  The patient was brought to the operative theater.   Once adequate anesthesia, preoperative antibiotics, 2 gm of of Ancef, 10mg  of decadron administered, the patient was positioned supine with the left thigh tourniquet placed.  The  left lower extremity was prepped and draped in sterile fashion.  A time-   out was performed identifying the patient, planned procedure, and   extremity.      The left lower extremity was placed in the [facility] leg holder.  The leg was   exsanguinated, tourniquet elevated to 250 mmHg.  A midline incision was  made followed by median parapatellar arthrotomy.  Following initial   exposure, attention was first directed to the patella.  Precut   measurement was noted to be 26 mm.  I resected down to 15 mm and used a   38 patellar button to restore patellar height as well as cover the cut   surface.      The lug holes were drilled and a metal shim was placed to protect the   patella from retractors and saw blades.      At this point, attention was now directed to the femur.  The femoral   canal was opened with a drill, irrigated to try to prevent fat emboli.  An   intramedullary rod was passed at 3 degrees valgus, 9 mm of bone was   resected off the distal femur.  At this point I debrided out the medial femoral condyle of necrotic bone.  It was a contained defect that I did not feel needed wedge or screw augmentation.  Following this resection, the tibia was   subluxated anteriorly.  Using the extramedullary guide, 2 mm of bone was resected off   the proximal medial tibia.  We confirmed the gap would be   stable medially and laterally with a size 6 mm insert as well as confirmed   the cut  was perpendicular in the coronal plane, checking with an alignment rod.      Once this was done, I sized the femur to be a size 6 in the anterior-   posterior dimension, chose a standard component based on medial and   lateral dimension.  The size 6 rotation block was then pinned in   position anterior referenced using the C-clamp to set rotation.  The   anterior, posterior, and  chamfer cuts were made without difficulty nor   notching making certain that I was along the anterior cortex to help   with flexion gap stability.      The final box cut was made off the lateral aspect of distal femur.      At this point, the tibia was sized to be a size 6, the size 6 tray was   then pinned in position through the medial third of the tubercle,   drilled, and keel punched.  Trial reduction was now carried with a 6 femur,  6 tibia, a size 6 up to the size 8 mm insert, and the 38 patella botton.  The knee was brought to   extension, full extension with good flexion stability with the patella   tracking through the trochlea without application of pressure.  Given   all these findings, the trial components removed.  Final components were   opened and cement was mixed.  The knee was irrigated with normal saline   solution and pulse lavage.  The synovial lining was   then injected with 30cc of 0.25% Marcaine with epinephrine and 1 cc of Toradol plus 30 cc of NS for a   total of 61 cc.      The knee was irrigated.  Final implants were then cemented onto clean and   dried cut surfaces of bone with the knee brought to extension with a size 8 mm trial insert.      Once the cement had fully cured, the excess cement was removed   throughout the knee.  I confirmed I was satisfied with the range of   motion and stability, and the final size 8 mm AOX PS  insert was chosen.  It was   placed into the knee.      The tourniquet had been let down at 35 minutes.  No significant   hemostasis required.  The    extensor mechanism was then reapproximated using #1 Vicryl and #0 V-lock sutures with the knee   in flexion.  The   remaining wound was closed with 2-0 Vicryl and running 4-0 Monocryl.   The knee was cleaned, dried, dressed sterilely using Dermabond and   Aquacel dressing.  The patient was then   brought to recovery room in stable condition, tolerating the procedure   well.   Please note that Physician Assistant, Danae Orleans, PA-C, was present for the entirety of the case, and was utilized for pre-operative positioning, peri-operative retractor management, general facilitation of the procedure.  He was also utilized for primary wound closure at the end of the case.              Pietro Cassis Brandon Dame, M.D.    01/08/2015 2:12 PM

## 2015-01-08 NOTE — Transfer of Care (Signed)
Immediate Anesthesia Transfer of Care Note  Patient: Brandon Colon  Procedure(s) Performed: Procedure(s): TOTAL LEFT  KNEE ARTHROPLASTY (Left)  Patient Location: PACU  Anesthesia Type:Spinal  Level of Consciousness: awake, alert , oriented and patient cooperative  Airway & Oxygen Therapy: Patient Spontanous Breathing and Patient connected to face mask oxygen  Post-op Assessment: Report given to RN and Post -op Vital signs reviewed and stable  Post vital signs: Reviewed and stable  Last Vitals:  Filed Vitals:   01/08/15 0952  BP: 175/99  Pulse: 72  Temp: 37.2 C  Resp: 18    Complications: No apparent anesthesia complications

## 2015-01-08 NOTE — Anesthesia Procedure Notes (Signed)
Spinal  Start time: 01/08/2015 12:44 PM End time: 01/08/2015 12:49 PM Staffing Resident/CRNA: Sherian Maroon A Performed by: resident/CRNA  Preanesthetic Checklist Completed: patient identified, site marked, surgical consent, pre-op evaluation, timeout performed, IV checked, risks and benefits discussed and monitors and equipment checked Spinal Block Patient position: sitting Prep: Betadine Patient monitoring: heart rate, continuous pulse ox and blood pressure Approach: midline Location: L4-5 Injection technique: single-shot Needle Needle type: Sprotte  Needle gauge: 24 G Needle length: 9 cm Needle insertion depth: 5 cm

## 2015-01-08 NOTE — Progress Notes (Signed)
Utilization review completed.  L. J. Kyian Obst RN, BSN, CM 

## 2015-01-09 ENCOUNTER — Encounter (HOSPITAL_COMMUNITY): Payer: Self-pay | Admitting: Orthopedic Surgery

## 2015-01-09 LAB — BASIC METABOLIC PANEL
Anion gap: 7 (ref 5–15)
BUN: 9 mg/dL (ref 6–20)
CALCIUM: 8.2 mg/dL — AB (ref 8.9–10.3)
CHLORIDE: 105 mmol/L (ref 101–111)
CO2: 25 mmol/L (ref 22–32)
Creatinine, Ser: 0.77 mg/dL (ref 0.61–1.24)
GFR calc Af Amer: >60 mL/min (ref >60)
GFR calc non Af Amer: >60 mL/min (ref >60)
Glucose, Bld: 125 mg/dL — ABNORMAL HIGH (ref 65–99)
POTASSIUM: 3.8 mmol/L (ref 3.5–5.1)
SODIUM: 137 mmol/L (ref 135–145)

## 2015-01-09 LAB — CBC
HCT: 32.5 % — ABNORMAL LOW (ref 39.0–52.0)
Hemoglobin: 11.4 g/dL — ABNORMAL LOW (ref 13.0–17.0)
MCH: 29.8 pg (ref 26.0–34.0)
MCHC: 35.1 g/dL (ref 30.0–36.0)
MCV: 85.1 fL (ref 78.0–100.0)
Platelets: 135 10*3/uL — ABNORMAL LOW (ref 150–400)
RBC: 3.82 MIL/uL — ABNORMAL LOW (ref 4.22–5.81)
RDW: 12.5 % (ref 11.5–15.5)
WBC: 12.2 10*3/uL — AB (ref 4.0–10.5)

## 2015-01-09 MED ORDER — ASPIRIN 325 MG PO TBEC: 325.0000 mg | DELAYED_RELEASE_TABLET | Freq: Two times a day (BID) | ORAL | Status: AC

## 2015-01-09 MED ORDER — POLYETHYLENE GLYCOL 3350 17 G PO PACK: 17.0000 g | PACK | Freq: Two times a day (BID) | ORAL | Status: DC

## 2015-01-09 MED ORDER — METHOCARBAMOL 500 MG PO TABS: 500.0000 mg | ORAL_TABLET | Freq: Four times a day (QID) | ORAL | Status: DC | PRN

## 2015-01-09 MED ORDER — FERROUS SULFATE 325 (65 FE) MG PO TABS: 325.0000 mg | ORAL_TABLET | Freq: Three times a day (TID) | ORAL | Status: DC

## 2015-01-09 MED ORDER — HYDROCODONE-ACETAMINOPHEN 7.5-325 MG PO TABS: 1.0000 | ORAL_TABLET | ORAL | Status: DC | PRN

## 2015-01-09 MED ORDER — DOCUSATE SODIUM 100 MG PO CAPS: 100.0000 mg | ORAL_CAPSULE | Freq: Two times a day (BID) | ORAL | Status: DC

## 2015-01-09 NOTE — Plan of Care (Signed)
Problem: Discharge Progression Outcomes Goal: Anticoagulant follow-up in place Outcome: Not Applicable Date Met:  09/90/68 asa

## 2015-01-09 NOTE — Progress Notes (Signed)
     Subjective: 1 Day Post-Op Procedure(s) (LRB): TOTAL LEFT  KNEE ARTHROPLASTY (Left)   Patient reports pain as mild, pain controlled. No events throughout the night.  States that he is ready to work with PT.  Stressed it's importance to restore the function in the knee. Ready to be discharged home if he does well with PT.   Objective:   VITALS:   Filed Vitals:   01/09/15 0516  BP: 158/96  Pulse: 68  Temp: 97.6 F (36.4 C)  Resp: 18    Dorsiflexion/Plantar flexion intact Incision: dressing C/D/I No cellulitis present Compartment soft  LABS  Recent Labs  01/09/15 0419  HGB 11.4*  HCT 32.5*  WBC 12.2*  PLT 135*     Recent Labs  01/09/15 0419  NA 137  K 3.8  BUN 9  CREATININE 0.77  GLUCOSE 125*     Assessment/Plan: 1 Day Post-Op Procedure(s) (LRB): TOTAL LEFT  KNEE ARTHROPLASTY (Left) Foley cath d/c'ed Advance diet Up with therapy D/C IV fluids Discharge home with home health  Follow up in 2 weeks at [facility] Follow up with OLIN,Emmert Roethler D in 2 weeks.  Contact information:  [facility] [address] [place] [phone]    Obese (BMI 30-39.9) Estimated body mass index is 30.26 kg/(m^2) as calculated from the following:   Height as of this encounter: 5\' 9"  (1.753 m).   Weight as of this encounter: 92.987 kg (205 lb). Patient also counseled that weight may inhibit the healing process Patient counseled that losing weight will help with future health issues      [place] Pugh. Deen Deguia   PAC  01/09/2015, 8:50 AM

## 2015-01-09 NOTE — Evaluation (Signed)
Physical Therapy Evaluation Patient Details Name: Brandon Colon MRN: [id number] DOB: [birth year] Today's Date: 01/09/2015   History of Present Illness  L TKR  Clinical Impression  Pt s/p L TKr presents with decreased L LE strength/ROM and post op pain limiting functional mobility.  Pt should progress well to dc home with family assist and HHPT follow up.    Follow Up Recommendations Home health PT    Equipment Recommendations  Rolling walker with 5" wheels    Recommendations for Other Services OT consult     Precautions / Restrictions Precautions Precautions: Fall;Knee Restrictions Weight Bearing Restrictions: No Other Position/Activity Restrictions: WBAT      Mobility  Bed Mobility Overal bed mobility: Needs Assistance Bed Mobility: Supine to Sit     Supine to sit: Min guard     General bed mobility comments: cues for sequence and use of R LE to self assist  Transfers Overall transfer level: Needs assistance Equipment used: Rolling walker (2 wheeled) Transfers: Sit to/from Stand Sit to Stand: Min guard         General transfer comment: cues for LE management and use of UEs to self assist  Ambulation/Gait Ambulation/Gait assistance: Min assist;Min guard Ambulation Distance (Feet): 76 Feet Assistive device: Rolling walker (2 wheeled) Gait Pattern/deviations: Step-to pattern;Decreased step length - right;Decreased step length - left;Shuffle;Trunk flexed     General Gait Details: cues for sequence, posture, position from RW, stride length  Stairs            Wheelchair Mobility    Modified Rankin (Stroke Patients Only)       Balance                                             Pertinent Vitals/Pain Pain Assessment: 0-10 Pain Score: 5  Pain Location: L knee Pain Descriptors / Indicators: Aching;Sore Pain Intervention(s): Limited activity within patient's tolerance;Monitored during session;Premedicated before session;Ice  applied    Home Living Family/patient expects to be discharged to:: Private residence Living Arrangements: Other relatives Available Help at Discharge: Family Type of Home: House Home Access: Stairs to enter Entrance Stairs-Rails: Right;Left;Can reach both Technical brewer of Steps: 3+1 Home Layout: One level Home Equipment: None      Prior Function Level of Independence: Independent               Hand Dominance        Extremity/Trunk Assessment   Upper Extremity Assessment: Overall WFL for tasks assessed           Lower Extremity Assessment: LLE deficits/detail   LLE Deficits / Details: 3/5 quads with AAROM at knee -10 - 65  Cervical / Trunk Assessment: Normal  Communication   Communication: No difficulties  Cognition Arousal/Alertness: Awake/alert Behavior During Therapy: WFL for tasks assessed/performed Overall Cognitive Status: Within Functional Limits for tasks assessed                      General Comments      Exercises Total Joint Exercises Ankle Circles/Pumps: AROM;Both;15 reps;Supine Quad Sets: AROM;Both;10 reps;Supine Heel Slides: AAROM;Left;15 reps;Supine Straight Leg Raises: AROM;AAROM;Left;15 reps;Supine      Assessment/Plan    PT Assessment Patient needs continued PT services  PT Diagnosis Difficulty walking   PT Problem List Decreased strength;Decreased range of motion;Decreased activity tolerance;Decreased mobility;Decreased knowledge of use of DME;Pain  PT Treatment Interventions  DME instruction;Gait training;Stair training;Functional mobility training;Therapeutic activities;Therapeutic exercise;Patient/family education   PT Goals (Current goals can be found in the Care Plan section) Acute Rehab PT Goals Patient Stated Goal: Resume previous lifestyle with decreased pain PT Goal Formulation: With patient Time For Goal Achievement: 01/16/15 Potential to Achieve Goals: Good    Frequency 7X/week   Barriers to  discharge        Co-evaluation               End of Session Equipment Utilized During Treatment: Gait belt Activity Tolerance: Patient tolerated treatment well Patient left: in chair;with call bell/phone within reach Nurse Communication: Mobility status         Time: 6440-3474 PT Time Calculation (min) (ACUTE ONLY): 32 min   Charges:   PT Evaluation $Initial PT Evaluation Tier I: 1 Procedure PT Treatments $Therapeutic Exercise: 8-22 mins   PT G Codes:        Brandon Colon Jan 16, 2015, 12:28 PM

## 2015-01-09 NOTE — Evaluation (Signed)
Occupational Therapy Evaluation Patient Details Name: Brandon Colon MRN: [id number] DOB: June 26, 1956 Today's Date: 01/09/2015    History of Present Illness L TKR   Clinical Impression   This 59 year old man was admitted for the above surgery.  All education was completed.  No further OT is needed at this time    Follow Up Recommendations  Supervision/Assistance - 24 hour    Equipment Recommendations  3 in 1 bedside comode (delivered)    Recommendations for Other Services       Precautions / Restrictions Precautions Precautions: Fall;Knee Restrictions Other Position/Activity Restrictions: WBAT      Mobility Bed Mobility               General bed mobility comments: oob  Transfers   Equipment used: Rolling walker (2 wheeled) Transfers: Sit to/from Stand Sit to Stand: Min guard         General transfer comment: cues for UE/LE placement    Balance                                            ADL Overall ADL's : Needs assistance/impaired     Grooming: Supervision/safety;Standing   Upper Body Bathing: Set up;Sitting   Lower Body Bathing: Minimal assistance;Sit to/from stand   Upper Body Dressing : Set up;Sitting   Lower Body Dressing: Maximal assistance;Sit to/from stand   Toilet Transfer: Min guard;Ambulation;BSC;RW   Toileting- Water quality scientist and Hygiene: Min guard;Sit to/from stand         General ADL Comments: ambulated to bathroom and used urinal.  Assisted pt with getting dressed:  educated on reacher--he would benefit from this vs. assistance from sister.  Pt wears shorts with belt, and this would be helpful if shorts fall down after he uses the toilet.  Reviewed safety and precautions.  Educated on tub transfer and readiness.  Recommended he practice with HHPT prior to doing on his own.       Vision     Perception     Praxis      Pertinent Vitals/Pain Pain Score: 2  Pain Location: L knee Pain Descriptors /  Indicators: Sore Pain Intervention(s): Limited activity within patient's tolerance;Monitored during session;Premedicated before session;Repositioned     Hand Dominance     Extremity/Trunk Assessment             Communication Communication Communication: No difficulties   Cognition Arousal/Alertness: Awake/alert Behavior During Therapy: WFL for tasks assessed/performed Overall Cognitive Status: Within Functional Limits for tasks assessed                     General Comments       Exercises       Shoulder Instructions      Home Living Family/patient expects to be discharged to:: Private residence Living Arrangements: Other relatives Available Help at Discharge: Family               Bathroom Shower/Tub: Tub/shower unit Shower/tub characteristics: Architectural technologist: Standard     Home Equipment: None          Prior Functioning/Environment Level of Independence: Independent             OT Diagnosis: Generalized weakness   OT Problem List:     OT Treatment/Interventions:      OT Goals(Current goals can be found in the care plan section)  Acute Rehab OT Goals Patient Stated Goal: return to being independent  OT Frequency:     Barriers to D/C:            Co-evaluation              End of Session    Activity Tolerance: Patient tolerated treatment well Patient left: in chair;with call bell/phone within reach   Time: 1401-1423 OT Time Calculation (min): 22 min Charges:  OT General Charges $OT Visit: 1 Procedure OT Evaluation $Initial OT Evaluation Tier I: 1 Procedure G-Codes:    Anniah Glick 2015/01/20, 2:50 PM   Lesle Chris, OTR/L [phone] 2015/01/20

## 2015-01-09 NOTE — Discharge Instructions (Signed)

## 2015-01-09 NOTE — Care Management Note (Addendum)
Case Management Note  Patient Details  Name: Kenshin Splawn MRN: [id number] Date of Birth: 06-12-1956  Subjective/Objective:                   TOTAL LEFT KNEE ARTHROPLASTY (Left) Action/Plan:  Discharge planning Expected Discharge Date:  01/09/15               Expected Discharge Plan:  [place]  In-House Referral:     Discharge planning Services  CM Consult  Post Acute Care Choice:  Home Health Choice offered to:  Patient  DME Arranged:  3-N-1, Walker rolling DME Agency:  [place]:  PT [place] Agency:  [place]  Status of Service:  Completed, signed off  Medicare Important Message Given:    Date Medicare IM Given:    Medicare IM give by:    Date Additional Medicare IM Given:    Additional Medicare Important Message give by:     If discussed at [place] of Stay Meetings, dates discussed:    Additional Comments: CM met with pt in room to offer choice of home health agency.  Pt chooses [facility] to render HHPT.  Address and contact information verified by pt.  Referral texted to [place] rep, Tim.  CM called [facility] DME rep, Lecretia to please deliver the rolling walker and 3n1 to room. NO other CM needs were communicated. Dellie Catholic, RN 01/09/2015, 12:26 PM

## 2015-01-09 NOTE — Progress Notes (Signed)
Physical Therapy Treatment Patient Details Name: Quamir Willemsen MRN: [id number] DOB: 1955-10-31 Today's Date: 01/09/2015    History of Present Illness L TKR    PT Comments    Pt progressing well with mobility.  Reviewed stairs with pt and sister.  Follow Up Recommendations  Home health PT     Equipment Recommendations  Rolling walker with 5" wheels    Recommendations for Other Services OT consult     Precautions / Restrictions Precautions Precautions: Fall;Knee Restrictions Weight Bearing Restrictions: No Other Position/Activity Restrictions: WBAT    Mobility  Bed Mobility               General bed mobility comments: oob  Transfers Overall transfer level: Needs assistance Equipment used: Rolling walker (2 wheeled) Transfers: Sit to/from Stand Sit to Stand: Min guard;Supervision         General transfer comment: cues for UE/LE placement  Ambulation/Gait Ambulation/Gait assistance: Min guard;Supervision Ambulation Distance (Feet): 65 Feet Assistive device: Rolling walker (2 wheeled) Gait Pattern/deviations: Step-to pattern;Shuffle;Trunk flexed     General Gait Details: cues for sequence, posture, position from RW, stride length   Stairs Stairs: Yes Stairs assistance: Min assist Stair Management: No rails;Two rails;Step to pattern;Forwards;With walker Number of Stairs: 9 General stair comments: 7 stairs with bil rails, single step twice with RW, cues for sequence and foot/RW placement,   Pts older sister present  Wheelchair Mobility    Modified Rankin (Stroke Patients Only)       Balance                                    Cognition Arousal/Alertness: Awake/alert Behavior During Therapy: WFL for tasks assessed/performed Overall Cognitive Status: Within Functional Limits for tasks assessed                      Exercises Total Joint Exercises Ankle Circles/Pumps: AROM;Both;15 reps;Supine Quad Sets: AROM;Both;10  reps;Supine Heel Slides: AAROM;Left;15 reps;Supine Straight Leg Raises: AROM;AAROM;Left;15 reps;Supine    General Comments        Pertinent Vitals/Pain Pain Assessment: 0-10 Pain Score: 3  Pain Location: L knee Pain Descriptors / Indicators: Aching;Sore Pain Intervention(s): Limited activity within patient's tolerance;Monitored during session;Premedicated before session;Ice applied    Home Living Family/patient expects to be discharged to:: Private residence Living Arrangements: Other relatives Available Help at Discharge: Family         Home Equipment: None      Prior Function Level of Independence: Independent          PT Goals (current goals can now be found in the care plan section) Acute Rehab PT Goals Patient Stated Goal: return to being independent PT Goal Formulation: With patient Time For Goal Achievement: 01/16/15 Potential to Achieve Goals: Good Progress towards PT goals: Progressing toward goals    Frequency  7X/week    PT Plan Current plan remains appropriate    Co-evaluation             End of Session Equipment Utilized During Treatment: Gait belt Activity Tolerance: Patient tolerated treatment well Patient left: in chair;with call bell/phone within reach;with family/visitor present     Time: 1425-1455 PT Time Calculation (min) (ACUTE ONLY): 30 min  Charges:  $Gait Training: 8-22 mins $Therapeutic Exercise: 8-22 mins                    G Codes:  Lindsy Cerullo 01/09/2015, 3:02 PM

## 2015-01-17 NOTE — Discharge Summary (Signed)
Physician Discharge Summary  Patient ID: Brandon Colon MRN: [id number] DOB/AGE: Sep 19, 1955 59 y.o.  Admit date: 01/08/2015 Discharge date: 01/09/2015   Procedures:  Procedure(s) (LRB): TOTAL LEFT  KNEE ARTHROPLASTY (Left)  Attending Physician:  Dr. Paralee Cancel   Admission Diagnoses:   Left knee AVN / pain  Discharge Diagnoses:  Principal Problem:   S/P left TKA Active Problems:   S/P knee replacement  Past Medical History  Diagnosis Date  . HTN (hypertension)   . Arthritis   . Avascular necrosis     LEFT KNEE  . Difficulty sleeping     DUE TO KNEE PAIN    HPI:    Brandon Colon, 59 y.o. male, has a history of pain and functional disability in the left knee due to arthritis and AVN and has failed non-surgical conservative treatments for greater than 12 weeks to include NSAID's and/or analgesics, corticosteriod injections, use of assistive devices and activity modification. Onset of symptoms was gradual, starting ~1 years ago with gradually worsening course since that time. The patient noted no past surgery on the left knee(s). Patient currently rates pain in the left knee(s) at 10 out of 10 with activity. Patient has worsening of pain with activity and weight bearing, pain that interferes with activities of daily living, pain with passive range of motion, crepitus and joint swelling. Patient has evidence of periarticular osteophytes and joint space narrowing by imaging studies. There is no active infection. Risks, benefits and expectations were discussed with the patient. Risks including but not limited to the risk of anesthesia, blood clots, nerve damage, blood vessel damage, failure of the prosthesis, infection and up to and including death. Patient understand the risks, benefits and expectations and wishes to proceed with surgery.   PCP: Mauricio Po, [name]   Discharged Condition: good  Hospital Course:  Patient underwent the above stated procedure on 01/08/2015. Patient  tolerated the procedure well and brought to the recovery room in good condition and subsequently to the floor.  POD #1 BP: 158/96 ; Pulse: 68 ; Temp: 97.6 F (36.4 C) ; Resp: 18 Patient reports pain as mild, pain controlled. No events throughout the night. States that he is ready to work with PT. Stressed it's importance to restore the function in the knee. Ready to be discharged home. Dorsiflexion/plantar flexion intact, incision: dressing C/D/I, no cellulitis present and compartment soft.   LABS  Basename    HGB  11.4  HCT  32.5    Discharge Exam: General appearance: alert, cooperative and no distress Extremities: Homans sign is negative, no sign of DVT, no edema, redness or tenderness in the calves or thighs and no ulcers, gangrene or trophic changes  Disposition: Home with follow up in 2 weeks   Follow-up Information    Follow up with Brandon Pole, MD. Schedule an appointment as soon as possible for a visit in 2 weeks.   Specialty:  Orthopedic Surgery   Contact information:   [address] [place] [address] [phone]       Follow up with [facility].   Why:  home health physical therapy   Contact information:   [address] [place] [address] [phone]       Follow up with [facility].   Why:  3n1 (over the commode seat) and a rolling walker   Contact information:   [address] [place] [address] [phone]       Discharge  Instructions    Call MD / Call 911    Complete by:  As directed   If you experience chest pain or shortness of breath, CALL 911 and be transported to the hospital emergency room.  If you develope a fever above 101 F, pus (white drainage) or increased drainage or redness at the wound, or calf pain, call your surgeon's office.     Change dressing    Complete by:  As directed   Maintain surgical dressing until follow up in the clinic. If the edges start to pull  up, may reinforce with tape. If the dressing is no longer working, may remove and cover with gauze and tape, but must keep the area dry and clean.  Call with any questions or concerns.     Constipation Prevention    Complete by:  As directed   Drink plenty of fluids.  Prune juice may be helpful.  You may use a stool softener, such as Colace (over the counter) 100 mg twice a day.  Use MiraLax (over the counter) for constipation as needed.     Diet - low sodium heart healthy    Complete by:  As directed      Discharge instructions    Complete by:  As directed   Maintain surgical dressing until follow up in the clinic. If the edges start to pull up, may reinforce with tape. If the dressing is no longer working, may remove and cover with gauze and tape, but must keep the area dry and clean.  Follow up in 2 weeks at [facility]. Call with any questions or concerns.     Increase activity slowly as tolerated    Complete by:  As directed      TED hose    Complete by:  As directed   Use stockings (TED hose) for 2 weeks on both leg(s).  You may remove them at night for sleeping.     Weight bearing as tolerated    Complete by:  As directed   Laterality:  left  Extremity:  Lower             Medication List    STOP taking these medications        aspirin-acetaminophen-caffeine 250-250-65 MG per tablet  Commonly known as:  EXCEDRIN MIGRAINE     TYLENOL 8 HOUR ARTHRITIS PAIN 650 MG CR tablet  Generic drug:  acetaminophen      TAKE these medications        aspirin 325 MG EC tablet  Take 1 tablet (325 mg total) by mouth 2 (two) times daily.     cloNIDine 0.2 MG tablet  Commonly known as:  CATAPRES  Take 1 tablet (0.2 mg total) by mouth at bedtime.     docusate sodium 100 MG capsule  Commonly known as:  COLACE  Take 1 capsule (100 mg total) by mouth 2 (two) times daily.     ferrous sulfate 325 (65 FE) MG tablet  Take 1 tablet (325 mg total) by mouth 3 (three) times daily  after meals.     HYDROcodone-acetaminophen 7.5-325 MG per tablet  Commonly known as:  NORCO  Take 1-2 tablets by mouth every 4 (four) hours as needed for moderate pain.     lisinopril 40 MG tablet  Commonly known as:  PRINIVIL,ZESTRIL  Take 1 tablet (40 mg total) by mouth daily.     methocarbamol 500 MG tablet  Commonly known as:  ROBAXIN  Take 1 tablet (  500 mg total) by mouth every 6 (six) hours as needed for muscle spasms.     polyethylene glycol packet  Commonly known as:  MIRALAX / GLYCOLAX  Take 17 g by mouth 2 (two) times daily.         Signed: [place] Pugh. Clovis Warwick   PA-C  01/17/2015, 11:26 AM

## 2015-01-18 ENCOUNTER — Telehealth: Payer: Self-pay | Admitting: Family

## 2015-01-18 NOTE — Telephone Encounter (Signed)
PLEASE NOTE: All timestamps contained within this report are represented as [place] Standard Time. CONFIDENTIALTY NOTICE: This fax transmission is intended only for the addressee. It contains information that is legally privileged, confidential or otherwise protected from use or disclosure. If you are not the intended recipient, you are strictly prohibited from reviewing, disclosing, copying using or disseminating any of this information or taking any action in reliance on or regarding this information. If you have received this fax in error, please notify us immediately by telephone so that we can arrange for its return to [place]. Phone: [phone], Toll-Free: [phone], Fax: [phone] Page: 1 of 2 Call Id: [id number] [name] - [name] Patient Name: Brandon Colon DOB: [birth year] Initial Comment Caller states the patient's bottom lip is swollen. He had knee replacement surgery on 7/5 and has been taking muscle relaxer and hydrocodone for pain. Nurse Assessment Nurse: Marcelline Deist, RN, Lynda Date/Time (Eastern Time): 01/18/2015 10:58:55 AM Confirm and document reason for call. If symptomatic, describe symptoms. ---Caller states the patient's bottom lip is swollen & red. He has feeling in lip. He had a tomato sandwich yesterday, no reaction in past. No food allergies. He had knee replacement surgery on 7/5 and has been taking muscle relaxer - Methocarbamol and Hydrocodone for pain. No other symptoms. Has the patient traveled out of the country within the last 30 days? ---Not Applicable Does the patient require triage? ---Yes Related visit to physician within the last 2 weeks? ---No Does the PT have any chronic conditions? (i.e. diabetes, asthma, etc.) ---Yes List chronic conditions. ---knee replacement, hypertension Guidelines Guideline Title Affirmed Question Affirmed Notes Lip Swelling Taking an ACE Inhibitor  medication (e.g., benazepril/LOTENSIN, captopril/ CAPOTEN, enalapril/VASOTEC, lisinopril/ ZESTRIL) Final Disposition User Go to ED Now Marcelline Deist, RN, Kermit Balo Comments Nurse will contact office re: ER outcome. Pt. is on Lisinopril which the guideline criteria indicates he should be seen in the ER. The bottom lip is swollen, more pronounced on the right side according to caller. The patient's contact #'s are: [phone] (home) & [phone] (cell). The caller's clinical manager # is [phone] in case unable to get through to patient. The patient has been taking the medications since his knee replacement & has been on Lisinopril. Spoke with Lovena Le, who works with Dr. Elna Breslow & she said it would be ok to monitor this from home right now & if he develops any other symptoms such as swelling in the throat or mouth. or difficulty breathing, to be seen in the ER. Nurse will let patient know. Referrals GO TO FACILITY UNDECIDED REFERRED TO PCP OFFICE

## 2015-01-18 NOTE — Telephone Encounter (Signed)
Patients physical therapist from [facility] called stating patient has an extremely swollen lower lip. He had a knee replacement on 7/5 and has been on pain medication and muscle relaxer, but no new medication.  Just wanted to let you know that I did send her to the team nurse line.

## 2015-01-18 NOTE — Telephone Encounter (Signed)
Spoke to pt to confirm that he understood the instruction of any sx changes to report to ER STAT and to use cold compression and monitor sx closely.

## 2015-01-18 NOTE — Telephone Encounter (Signed)
Spoke with physical therapist and asked if there were any associated sxs. No swelling of the tongue, throat, or SOB. No fever chills, nausea, vomiting, or diarrhea. Just lip swelling. Advised if sxs worsen or pt gets swelling in the mouth or SOB to go to the ER. PT understood and will keep an eye on lip and any other associated sxs for now.

## 2015-05-13 ENCOUNTER — Other Ambulatory Visit: Payer: Self-pay | Admitting: Family

## 2016-03-31 ENCOUNTER — Ambulatory Visit (INDEPENDENT_AMBULATORY_CARE_PROVIDER_SITE_OTHER): Payer: BC Managed Care – PPO | Admitting: Family

## 2016-03-31 ENCOUNTER — Encounter: Payer: Self-pay | Admitting: Family

## 2016-03-31 VITALS — BP 160/120 | HR 74 | Temp 97.8°F | Resp 16 | Ht 69.0 in | Wt 192.8 lb

## 2016-03-31 DIAGNOSIS — Z1211 Encounter for screening for malignant neoplasm of colon: Secondary | ICD-10-CM

## 2016-03-31 DIAGNOSIS — Z23 Encounter for immunization: Secondary | ICD-10-CM | POA: Diagnosis not present

## 2016-03-31 DIAGNOSIS — Z0000 Encounter for general adult medical examination without abnormal findings: Secondary | ICD-10-CM

## 2016-03-31 DIAGNOSIS — Z7289 Other problems related to lifestyle: Secondary | ICD-10-CM | POA: Diagnosis not present

## 2016-03-31 DIAGNOSIS — I10 Essential (primary) hypertension: Secondary | ICD-10-CM

## 2016-03-31 MED ORDER — CLONIDINE HCL 0.2 MG PO TABS: 0.2000 mg | ORAL_TABLET | Freq: Every day | ORAL | 1 refills | Status: DC

## 2016-03-31 MED ORDER — LISINOPRIL 40 MG PO TABS
40.0000 mg | ORAL_TABLET | Freq: Every day | ORAL | 1 refills | Status: DC
Start: 2016-03-31 — End: 2016-09-07

## 2016-03-31 NOTE — Assessment & Plan Note (Signed)
1) Anticipatory Guidance: Discussed importance of wearing a seatbelt while driving and not texting while driving; changing batteries in smoke detector at least once annually; wearing suntan lotion when outside; eating a balanced and moderate diet; getting physical activity at least 30 minutes per day.  2) Immunizations / Screenings / Labs:  Influenza updated today. Declines shingles. All other immunizations are up-to-date per recommendations. Due for a dental and vision screen encouraged to be completed independently. Due for colon cancer screening with referral for colonoscopy place. Obtain PSA for prostate cancer screening. Obtain hepatitis C antibody for hepatitis C screening. All other screenings are up-to-date per recommendations. Obtain CBC, CMET, and lipid profile.   Overall well exam with risk factors for cardiovascular chronic diseases including overweight and uncontrolled hypertension. Recommend weight loss of 5-10% of current body weight through nutrition and physical activity. Discussed importance of taking medications as prescribed for hypertension to prevent end organ damage in the future. Continue other healthy last behaviors and choices. Follow-up prevention exam in 1 year. Follow-up office visit for chronic conditions.

## 2016-03-31 NOTE — Assessment & Plan Note (Signed)
Blood pressure remains uncontrolled and above goal 140/90 with questionable patient compliance with medication regimen. No symptoms of end organ damage or worse headache of life. Emphasize importance of taking medication and reducing risk for end organ damage in the future. Medication refills sent with continuation of current dosages of clonidine and lisinopril. Encouraged to monitor blood pressure at home and follow low-sodium diet. Follow-up in one month or sooner if needed.

## 2016-03-31 NOTE — Assessment & Plan Note (Signed)
1) Anticipatory Guidance: Discussed importance of wearing a seatbelt while driving and not texting while driving; changing batteries in smoke detector at least once annually; wearing suntan lotion when outside; eating a balanced and moderate diet; getting physical activity at least 30 minutes per day.  2) Immunizations / Screenings / Labs:  Influenza updated today. Declines Shingles vaccination. All other immunizations are up to date per recommendations. Due for a dental and vision exam encouraged to be completed independently. Due for colon cancer screen with referral for colonoscopy placed. Obtain PSA for prostate cancer screening. Obtain Hepatitis C antibody for Hepatitis C screening. All other screenings are up to date per recommendations. Obtain CBC, CMET, and lipid profile.    Overall well exam with risk factors for cardiovascular disease including uncontrolled hypertension and overweight. Has been out of his medications recently with new prescriptions sent. Recommend weight loss of 5-10% of current body weight through nutrition and continued physical activity. Continue other healthy lifestyle behaviors and choices. Follow up prevention exam in 1 year. Follow up office visit for chronic conditions.

## 2016-03-31 NOTE — Progress Notes (Signed)
Subjective:    Patient ID: Brandon Colon, male    DOB: [birth year], 60 y.o.   MRN: [id number]  Chief Complaint  Patient presents with  . CPE    not fasting    HPI:  Brandon Colon is a 60 y.o. male who presents today for an annual wellness visit.   1) Health Maintenance -   Diet - Averages about 2-3 meals per day consisting of a regular diet. Caffeine intake of about 1-2 cups per day.   Exercise - Walking around at work.    2) Preventative Exams / Immunizations:  Dental -- Due for exam  Vision -- Due for exam    Health Maintenance  Topic Date Due  . Hepatitis C Screening  09-11-1955  . HIV Screening  10/28/1970  . COLONOSCOPY  10/27/2005  . ZOSTAVAX  10/28/2015  . INFLUENZA VACCINE  02/04/2016  . TETANUS/TDAP  12/20/2023    Immunization History  Administered Date(s) Administered  . Influenza,inj,Quad PF,36+ Mos 03/31/2016     No Known Allergies   Outpatient Medications Prior to Visit  Medication Sig Dispense Refill  . cloNIDine (CATAPRES) 0.2 MG tablet Take 1 tablet (0.2 mg total) by mouth at bedtime. 90 tablet 1  . lisinopril (PRINIVIL,ZESTRIL) 40 MG tablet take 1 tablet by mouth once daily 90 tablet 1  . docusate sodium (COLACE) 100 MG capsule Take 1 capsule (100 mg total) by mouth 2 (two) times daily. 10 capsule 0  . ferrous sulfate 325 (65 FE) MG tablet Take 1 tablet (325 mg total) by mouth 3 (three) times daily after meals.  3  . HYDROcodone-acetaminophen (NORCO) 7.5-325 MG per tablet Take 1-2 tablets by mouth every 4 (four) hours as needed for moderate pain. 100 tablet 0  . methocarbamol (ROBAXIN) 500 MG tablet Take 1 tablet (500 mg total) by mouth every 6 (six) hours as needed for muscle spasms. 50 tablet 0  . polyethylene glycol (MIRALAX / GLYCOLAX) packet Take 17 g by mouth 2 (two) times daily. 14 each 0   No facility-administered medications prior to visit.      Past Medical History:  Diagnosis Date  . Arthritis   . Avascular necrosis (HCC)    LEFT KNEE  . Difficulty sleeping    DUE TO KNEE PAIN  . HTN (hypertension)      Past Surgical History:  Procedure Laterality Date  . FINGER SURGERY  1989   RT INDEX FINGER  . TOTAL KNEE ARTHROPLASTY Left 01/08/2015   Procedure: TOTAL LEFT  KNEE ARTHROPLASTY;  Surgeon: Paralee Cancel, MD;  Location: [facility];  Service: Orthopedics;  Laterality: Left;     Family History  Problem Relation Age of Onset  . Heart disease Mother   . Hypertension Mother   . Heart disease Father   . Hypertension Father   . Hypertension Maternal Grandmother   . Hypertension Maternal Grandfather   . Hypertension Paternal Grandmother   . Hypertension Paternal Grandfather      Social History   Social History  . Marital status: Single    Spouse name: N/A  . Number of children: 0  . Years of education: 12   Occupational History  . Custodian    Social History Main Topics  . Smoking status: Never Smoker  . Smokeless tobacco: Never Used  . Alcohol use Yes     Comment: occasionally  . Drug use: No  . Sexual activity: Not on file   Other Topics Concern  . Not on file  Social History Narrative   Fun: Running, movies, watch TV   Denies religious beliefs effecting health care.       Review of Systems  Constitutional: Denies fever, chills, fatigue, or significant weight gain/loss. HENT: Head: Denies headache or neck pain Ears: Denies changes in hearing, ringing in ears, earache, drainage Nose: Denies discharge, stuffiness, itching, nosebleed, sinus pain Throat: Denies sore throat, hoarseness, dry mouth, sores, thrush Eyes: Denies loss/changes in vision, pain, redness, blurry/double vision, flashing lights Cardiovascular: Denies chest pain/discomfort, tightness, palpitations, shortness of breath with activity, difficulty lying down, swelling, sudden awakening with shortness of breath Respiratory: Denies shortness of breath, cough, sputum production, wheezing Gastrointestinal: Denies dysphasia,  heartburn, change in appetite, nausea, change in bowel habits, rectal bleeding, constipation, diarrhea, yellow skin or eyes Genitourinary: Denies frequency, urgency, burning/pain, blood in urine, incontinence, change in urinary strength. Musculoskeletal: Denies muscle/joint pain, stiffness, back pain, redness or swelling of joints, trauma Skin: Denies rashes, lumps, itching, dryness, color changes, or hair/nail changes Neurological: Denies dizziness, fainting, seizures, weakness, numbness, tingling, tremor Psychiatric - Denies nervousness, stress, depression or memory loss Endocrine: Denies heat or cold intolerance, sweating, frequent urination, excessive thirst, changes in appetite Hematologic: Denies ease of bruising or bleeding     Objective:     BP (!) 160/120 (BP Location: Left Arm, Patient Position: Sitting, Cuff Size: Normal)   Pulse 74   Temp 97.8 F (36.6 C) (Oral)   Resp 16   Ht 5\' 9"  (1.753 m)   Wt 192 lb 12.8 oz (87.5 kg)   SpO2 95%   BMI 28.47 kg/m  Nursing note and vital signs reviewed.  Physical Exam  Constitutional: He is oriented to person, place, and time. He appears well-developed and well-nourished.  HENT:  Head: Normocephalic.  Right Ear: Hearing, tympanic membrane, external ear and ear canal normal.  Left Ear: Hearing, tympanic membrane, external ear and ear canal normal.  Nose: Nose normal.  Mouth/Throat: Uvula is midline, oropharynx is clear and moist and mucous membranes are normal.  Eyes: Conjunctivae and EOM are normal. Pupils are equal, round, and reactive to light.  Neck: Neck supple. No JVD present. No tracheal deviation present. No thyromegaly present.  Cardiovascular: Normal rate, regular rhythm, normal heart sounds and intact distal pulses.   Pulmonary/Chest: Effort normal and breath sounds normal.  Abdominal: Soft. Bowel sounds are normal. He exhibits no distension and no mass. There is no tenderness. There is no rebound and no guarding.    Musculoskeletal: Normal range of motion. He exhibits no edema or tenderness.  Lymphadenopathy:    He has no cervical adenopathy.  Neurological: He is alert and oriented to person, place, and time. He has normal reflexes. No cranial nerve deficit. He exhibits normal muscle tone. Coordination normal.  Skin: Skin is warm and dry.  Psychiatric: He has a normal mood and affect. His behavior is normal. Judgment and thought content normal.       Assessment & Plan:   Problem List Items Addressed This Visit      Cardiovascular and Mediastinum   Essential hypertension    Blood pressure remains uncontrolled and above goal 140/90 with questionable patient compliance with medication regimen. No symptoms of end organ damage or worse headache of life. Emphasize importance of taking medication and reducing risk for end organ damage in the future. Medication refills sent with continuation of current dosages of clonidine and lisinopril. Encouraged to monitor blood pressure at home and follow low-sodium diet. Follow-up in one month or sooner if  needed.      Relevant Medications   cloNIDine (CATAPRES) 0.2 MG tablet   lisinopril (PRINIVIL,ZESTRIL) 40 MG tablet     Other   Routine general medical examination at a health care facility - Primary    1) Anticipatory Guidance: Discussed importance of wearing a seatbelt while driving and not texting while driving; changing batteries in smoke detector at least once annually; wearing suntan lotion when outside; eating a balanced and moderate diet; getting physical activity at least 30 minutes per day.  2) Immunizations / Screenings / Labs:  Influenza updated today. Declines shingles. All other immunizations are up-to-date per recommendations. Due for a dental and vision screen encouraged to be completed independently. Due for colon cancer screening with referral for colonoscopy place. Obtain PSA for prostate cancer screening. Obtain hepatitis C antibody for  hepatitis C screening. All other screenings are up-to-date per recommendations. Obtain CBC, CMET, and lipid profile.   Overall well exam with risk factors for cardiovascular chronic diseases including overweight and uncontrolled hypertension. Recommend weight loss of 5-10% of current body weight through nutrition and physical activity. Discussed importance of taking medications as prescribed for hypertension to prevent end organ damage in the future. Continue other healthy last behaviors and choices. Follow-up prevention exam in 1 year. Follow-up office visit for chronic conditions.        Relevant Orders   CBC   Comprehensive metabolic panel   Lipid panel   PSA    Other Visit Diagnoses    Colon cancer screening       Relevant Orders   Ambulatory referral to Gastroenterology   Encounter for immunization       Relevant Medications   cloNIDine (CATAPRES) 0.2 MG tablet   lisinopril (PRINIVIL,ZESTRIL) 40 MG tablet   Other Relevant Orders   Ambulatory referral to Gastroenterology   Flu Vaccine QUAD 36+ mos IM (Completed)   CBC   Comprehensive metabolic panel   Lipid panel   PSA   Other problems related to lifestyle       Relevant Orders   Hepatitis C antibody       I have discontinued Mr. Egeland docusate sodium, ferrous sulfate, HYDROcodone-acetaminophen, methocarbamol, and polyethylene glycol. I have also changed his lisinopril. Additionally, I am having him maintain his cloNIDine.   Meds ordered this encounter  Medications  . cloNIDine (CATAPRES) 0.2 MG tablet    Sig: Take 1 tablet (0.2 mg total) by mouth at bedtime.    Dispense:  90 tablet    Refill:  1  . lisinopril (PRINIVIL,ZESTRIL) 40 MG tablet    Sig: Take 1 tablet (40 mg total) by mouth daily.    Dispense:  90 tablet    Refill:  1     Follow-up: Return in about 1 month (around 04/30/2016), or if symptoms worsen or fail to improve.   Mauricio Po, [name]

## 2016-03-31 NOTE — Patient Instructions (Signed)
Thank you for choosing [facility].  SUMMARY AND INSTRUCTIONS:  Medication:  Your prescription(s) have been submitted to your pharmacy or been printed and provided for you. Please take as directed and contact our office if you believe you are having problem(s) with the medication(s) or have any questions.  Labs:  Please stop by the lab on the lower level of the building for your blood work. Your results will be released to MyChart (or called to you) after review, usually within 72 hours after test completion. If any changes need to be made, you will be notified at that same time.  1.) The lab is open from 7:30am to 5:30 pm Monday-Friday 2.) No appointment is necessary 3.) Fasting (if needed) is 6-8 hours after food and drink; black coffee and water are okay   Follow up:  If your symptoms worsen or fail to improve, please contact our office for further instruction, or in case of emergency go directly to the emergency room at the closest medical facility.   Health Maintenance, Male A healthy lifestyle and preventative care can promote health and wellness.  Maintain regular health, dental, and eye exams.  Eat a healthy diet. Foods like vegetables, fruits, whole grains, low-fat dairy products, and lean protein foods contain the nutrients you need and are low in calories. Decrease your intake of foods high in solid fats, added sugars, and salt. Get information about a proper diet from your health care provider, if necessary.  Regular physical exercise is one of the most important things you can do for your health. Most adults should get at least 150 minutes of moderate-intensity exercise (any activity that increases your heart rate and causes you to sweat) each week. In addition, most adults need muscle-strengthening exercises on 2 or more days a week.   Maintain a healthy weight. The body mass index (BMI) is a screening tool to identify possible weight problems. It provides an  estimate of body fat based on height and weight. Your health care provider can find your BMI and can help you achieve or maintain a healthy weight. For males 20 years and older:  A BMI below 18.5 is considered underweight.  A BMI of 18.5 to 24.9 is normal.  A BMI of 25 to 29.9 is considered overweight.  A BMI of 30 and above is considered obese.  Maintain normal blood lipids and cholesterol by exercising and minimizing your intake of saturated fat. Eat a balanced diet with plenty of fruits and vegetables. Blood tests for lipids and cholesterol should begin at age 20 and be repeated every 5 years. If your lipid or cholesterol levels are high, you are over age 50, or you are at high risk for heart disease, you may need your cholesterol levels checked more frequently.Ongoing high lipid and cholesterol levels should be treated with medicines if diet and exercise are not working.  If you smoke, find out from your health care provider how to quit. If you do not use tobacco, do not start.  Lung cancer screening is recommended for adults aged 55-80 years who are at high risk for developing lung cancer because of a history of smoking. A yearly low-dose CT scan of the lungs is recommended for people who have at least a 30-pack-year history of smoking and are current smokers or have quit within the past 15 years. A pack year of smoking is smoking an average of 1 pack of cigarettes a day for 1 year (for example, a 30-pack-year history of   smoking could mean smoking 1 pack a day for 30 years or 2 packs a day for 15 years). Yearly screening should continue until the smoker has stopped smoking for at least 15 years. Yearly screening should be stopped for people who develop a health problem that would prevent them from having lung cancer treatment.  If you choose to drink alcohol, do not have more than 2 drinks per day. One drink is considered to be 12 oz (360 mL) of beer, 5 oz (150 mL) of wine, or 1.5 oz (45 mL) of  liquor.  Avoid the use of street drugs. Do not share needles with anyone. Ask for help if you need support or instructions about stopping the use of drugs.  High blood pressure causes heart disease and increases the risk of stroke. High blood pressure is more likely to develop in:  People who have blood pressure in the end of the normal range (100-139/85-89 mm Hg).  People who are overweight or obese.  People who are African American.  If you are 18-39 years of age, have your blood pressure checked every 3-5 years. If you are 40 years of age or older, have your blood pressure checked every year. You should have your blood pressure measured twice--once when you are at a hospital or clinic, and once when you are not at a hospital or clinic. Record the average of the two measurements. To check your blood pressure when you are not at a hospital or clinic, you can use:  An automated blood pressure machine at a pharmacy.  A home blood pressure monitor.  If you are 45-79 years old, ask your health care provider if you should take aspirin to prevent heart disease.  Diabetes screening involves taking a blood sample to check your fasting blood sugar level. This should be done once every 3 years after age 45 if you are at a normal weight and without risk factors for diabetes. Testing should be considered at a younger age or be carried out more frequently if you are overweight and have at least 1 risk factor for diabetes.  Colorectal cancer can be detected and often prevented. Most routine colorectal cancer screening begins at the age of 50 and continues through age 75. However, your health care provider may recommend screening at an earlier age if you have risk factors for colon cancer. On a yearly basis, your health care provider may provide home test kits to check for hidden blood in the stool. A small camera at the end of a tube may be used to directly examine the colon (sigmoidoscopy or colonoscopy)  to detect the earliest forms of colorectal cancer. Talk to your health care provider about this at age 50 when routine screening begins. A direct exam of the colon should be repeated every 5-10 years through age 75, unless early forms of precancerous polyps or small growths are found.  People who are at an increased risk for hepatitis B should be screened for this virus. You are considered at high risk for hepatitis B if:  You were born in a country where hepatitis B occurs often. Talk with your health care provider about which countries are considered high risk.  Your parents were born in a high-risk country and you have not received a shot to protect against hepatitis B (hepatitis B vaccine).  You have HIV or AIDS.  You use needles to inject street drugs.  You live with, or have sex with, someone who has hepatitis B.    You are a man who has sex with other men (MSM).  You get hemodialysis treatment.  You take certain medicines for conditions like cancer, organ transplantation, and autoimmune conditions.  Hepatitis C blood testing is recommended for all people born from [birth year] through 1965 and any individual with known risk factors for hepatitis C.  Healthy men should no longer receive prostate-specific antigen (PSA) blood tests as part of routine cancer screening. Talk to your health care provider about prostate cancer screening.  Testicular cancer screening is not recommended for adolescents or adult males who have no symptoms. Screening includes self-exam, a health care provider exam, and other screening tests. Consult with your health care provider about any symptoms you have or any concerns you have about testicular cancer.  Practice safe sex. Use condoms and avoid high-risk sexual practices to reduce the spread of sexually transmitted infections (STIs).  You should be screened for STIs, including gonorrhea and chlamydia if:  You are sexually active and are younger than 24  years.  You are older than 24 years, and your health care provider tells you that you are at risk for this type of infection.  Your sexual activity has changed since you were last screened, and you are at an increased risk for chlamydia or gonorrhea. Ask your health care provider if you are at risk.  If you are at risk of being infected with HIV, it is recommended that you take a prescription medicine daily to prevent HIV infection. This is called pre-exposure prophylaxis (PrEP). You are considered at risk if:  You are a man who has sex with other men (MSM).  You are a heterosexual man who is sexually active with multiple partners.  You take drugs by injection.  You are sexually active with a partner who has HIV.  Talk with your health care provider about whether you are at high risk of being infected with HIV. If you choose to begin PrEP, you should first be tested for HIV. You should then be tested every 3 months for as long as you are taking PrEP.  Use sunscreen. Apply sunscreen liberally and repeatedly throughout the day. You should seek shade when your shadow is shorter than you. Protect yourself by wearing long sleeves, pants, a wide-brimmed hat, and sunglasses year round whenever you are outdoors.  Tell your health care provider of new moles or changes in moles, especially if there is a change in shape or color. Also, tell your health care provider if a mole is larger than the size of a pencil eraser.  A one-time screening for abdominal aortic aneurysm (AAA) and surgical repair of large AAAs by ultrasound is recommended for men aged 49-75 years who are current or former smokers.  Stay current with your vaccines (immunizations).   This information is not intended to replace advice given to you by your health care provider. Make sure you discuss any questions you have with your health care provider.  Document Released: 12/19/2007 Document Revised: 07/13/2014 Document Reviewed:  11/17/2010 Elsevier Interactive Patient Education 2016 [place] DASH stands for "Dietary Approaches to Stop Hypertension." The DASH eating plan is a healthy eating plan that has been shown to reduce high blood pressure (hypertension). Additional health benefits may include reducing the risk of type 2 diabetes mellitus, heart disease, and stroke. The DASH eating plan may also help with weight loss. WHAT DO I NEED TO KNOW ABOUT THE DASH EATING PLAN? For the DASH eating plan, you will  follow these general guidelines:  Choose foods with a percent daily value for sodium of less than 5% (as listed on the food label).  Use salt-free seasonings or herbs instead of table salt or sea salt.  Check with your health care provider or pharmacist before using salt substitutes.  Eat lower-sodium products, often labeled as "lower sodium" or "no salt added."  Eat fresh foods.  Eat more vegetables, fruits, and low-fat dairy products.  Choose whole grains. Look for the word "whole" as the first word in the ingredient list.  Choose fish and skinless chicken or [place] more often than red meat. Limit fish, poultry, and meat to 6 oz (170 g) each day.  Limit sweets, desserts, sugars, and sugary drinks.  Choose heart-healthy fats.  Limit cheese to 1 oz (28 g) per day.  Eat more home-cooked food and less restaurant, buffet, and fast food.  Limit fried foods.  Cook foods using methods other than frying.  Limit canned vegetables. If you do use them, rinse them well to decrease the sodium.  When eating at a restaurant, ask that your food be prepared with less salt, or no salt if possible. WHAT FOODS CAN I EAT? Seek help from a dietitian for individual calorie needs. Grains Whole grain or whole wheat bread. Brown rice. Whole grain or whole wheat pasta. Quinoa, bulgur, and whole grain cereals. Low-sodium cereals. Corn or whole wheat flour tortillas. Whole grain cornbread. Whole grain  crackers. Low-sodium crackers. Vegetables Fresh or frozen vegetables (raw, steamed, roasted, or grilled). Low-sodium or reduced-sodium tomato and vegetable juices. Low-sodium or reduced-sodium tomato sauce and paste. Low-sodium or reduced-sodium canned vegetables.  Fruits All fresh, canned (in natural juice), or frozen fruits. Meat and Other Protein Products Ground beef (85% or leaner), grass-fed beef, or beef trimmed of fat. Skinless chicken or [place]. Ground chicken or [place]. Pork trimmed of fat. All fish and seafood. Eggs. Dried beans, peas, or lentils. Unsalted nuts and seeds. Unsalted canned beans. Dairy Low-fat dairy products, such as skim or 1% milk, 2% or reduced-fat cheeses, low-fat ricotta or cottage cheese, or plain low-fat yogurt. Low-sodium or reduced-sodium cheeses. Fats and Oils Tub margarines without trans fats. Light or reduced-fat mayonnaise and salad dressings (reduced sodium). Avocado. Safflower, olive, or canola oils. Natural peanut or almond butter. Other Unsalted popcorn and pretzels. The items listed above may not be a complete list of recommended foods or beverages. Contact your dietitian for more options. WHAT FOODS ARE NOT RECOMMENDED? Grains White bread. White pasta. White rice. Refined cornbread. Bagels and croissants. Crackers that contain trans fat. Vegetables Creamed or fried vegetables. Vegetables in a cheese sauce. Regular canned vegetables. Regular canned tomato sauce and paste. Regular tomato and vegetable juices. Fruits Dried fruits. Canned fruit in light or heavy syrup. Fruit juice. Meat and Other Protein Products Fatty cuts of meat. Ribs, chicken wings, bacon, sausage, bologna, salami, chitterlings, fatback, hot dogs, bratwurst, and packaged luncheon meats. Salted nuts and seeds. Canned beans with salt. Dairy Whole or 2% milk, cream, half-and-half, and cream cheese. Whole-fat or sweetened yogurt. Full-fat cheeses or blue cheese. Nondairy creamers and  whipped toppings. Processed cheese, cheese spreads, or cheese curds. Condiments Onion and garlic salt, seasoned salt, table salt, and sea salt. Canned and packaged gravies. Worcestershire sauce. Tartar sauce. Barbecue sauce. Teriyaki sauce. Soy sauce, including reduced sodium. Steak sauce. Fish sauce. Oyster sauce. Cocktail sauce. Horseradish. Ketchup and mustard. Meat flavorings and tenderizers. Bouillon cubes. Hot sauce. Tabasco sauce. Marinades. Taco seasonings. Relishes. Fats and Oils  Butter, stick margarine, lard, shortening, ghee, and bacon fat. Coconut, palm kernel, or palm oils. Regular salad dressings. Other Pickles and olives. Salted popcorn and pretzels. The items listed above may not be a complete list of foods and beverages to avoid. Contact your dietitian for more information. WHERE CAN I FIND MORE INFORMATION? National Heart, Lung, and Blood Institute: travelstabloid.com   This information is not intended to replace advice given to you by your health care provider. Make sure you discuss any questions you have with your health care provider.   Document Released: 06/11/2011 Document Revised: 07/13/2014 Document Reviewed: 04/26/2013 Elsevier Interactive Patient Education [employer].

## 2016-04-17 ENCOUNTER — Encounter: Payer: Self-pay | Admitting: Gastroenterology

## 2016-04-30 ENCOUNTER — Ambulatory Visit (INDEPENDENT_AMBULATORY_CARE_PROVIDER_SITE_OTHER): Payer: BC Managed Care – PPO | Admitting: Family

## 2016-04-30 ENCOUNTER — Encounter: Payer: Self-pay | Admitting: Family

## 2016-04-30 VITALS — BP 120/78 | HR 64 | Temp 97.5°F | Wt 184.0 lb

## 2016-04-30 DIAGNOSIS — R2232 Localized swelling, mass and lump, left upper limb: Secondary | ICD-10-CM | POA: Diagnosis not present

## 2016-04-30 DIAGNOSIS — I10 Essential (primary) hypertension: Secondary | ICD-10-CM | POA: Diagnosis not present

## 2016-04-30 DIAGNOSIS — R2230 Localized swelling, mass and lump, unspecified upper limb: Secondary | ICD-10-CM | POA: Insufficient documentation

## 2016-04-30 NOTE — Assessment & Plan Note (Signed)
Blood pressure appears adequate control below goal 140/90 with current regimen and no adverse side effects. Continue current dosage of clonidine and lisinopril. Encouraged to monitor blood pressure, continue to follow low-sodium diet. Denies symptoms of end organ damage or worse headache of life. Continue to monitor.

## 2016-04-30 NOTE — Assessment & Plan Note (Signed)
Small mass/cyst of right fourth finger aspirated without complication. Post-care injection provided and follow-up if symptoms worsen or do not improve.

## 2016-04-30 NOTE — Patient Instructions (Signed)
Thank you for choosing [facility].  SUMMARY AND INSTRUCTIONS:  Keep the site clean with soap and water.  Monitor for signs of infection.  Follow up if symptoms return.  Continue to monitor blood pressure at home.   Medication:  Please continue to take your medications as prescribed.  Your prescription(s) have been submitted to your pharmacy or been printed and provided for you. Please take as directed and contact our office if you believe you are having problem(s) with the medication(s) or have any questions.  Follow up:  If your symptoms worsen or fail to improve, please contact our office for further instruction, or in case of emergency go directly to the emergency room at the closest medical facility.

## 2016-04-30 NOTE — Progress Notes (Signed)
Subjective:    Patient ID: Brandon Colon, male    DOB: [birth year], 60 y.o.   MRN: [id number]  Chief Complaint  Patient presents with  . Hypertension    HPI:  Brandon Colon is a 60 y.o. male who  has a past medical history of Arthritis; Avascular necrosis ([place]); Difficulty sleeping; and HTN (hypertension). and presents today for a follow up office visit.   1.) Hypertension - Recently seen for annual wellness visit and noted to have uncontrolled blood pressure. Currently prescribed lisinopril and clondine. Reports taking the medication as prescribed and denies adverse side effects. Blood pressure at home have been good. Denies worse headache of life any symptoms of end organ damage. Working on following a low-sodium diet. He has also lost about 5 pounds since his last visit.   BP Readings from Last 3 Encounters:  04/30/16 120/78  03/31/16 (!) 160/120  01/09/15 (!) 146/99   2.) Lump on hand - This is a new problem. Associated symptom of a lump located on the palm of his left hand has been going on for about a couple of weeks. There is mild discomfort. There is no trauma that he can recall. He does carry heavy equipment. May have gotten larger since initial onset. Denies any treatments or attempted modifying factors. 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    No Known Allergies    Outpatient Medications Prior to Visit  Medication Sig Dispense Refill  . cloNIDine (CATAPRES) 0.2 MG tablet Take 1 tablet (0.2 mg total) by mouth at bedtime. 90 tablet 1  . lisinopril (PRINIVIL,ZESTRIL) 40 MG tablet Take 1 tablet (40 mg total) by mouth daily. 90 tablet 1   No facility-administered medications prior to visit.       Past Surgical History:  Procedure Laterality Date  . FINGER SURGERY  1989   RT INDEX FINGER  . TOTAL KNEE ARTHROPLASTY Left 01/08/2015   Procedure: TOTAL LEFT  KNEE ARTHROPLASTY;  Surgeon: Paralee Cancel, MD;  Location: [facility];  Service: Orthopedics;  Laterality: Left;       Past Medical History:  Diagnosis Date  . Arthritis   . Avascular necrosis ([facility])    LEFT KNEE  . Difficulty sleeping    DUE TO KNEE PAIN  . HTN (hypertension)       Review of Systems  Constitutional: Negative for chills and fever.  Eyes:       Negative for changes in vision  Respiratory: Negative for cough, chest tightness and wheezing.   Cardiovascular: Negative for chest pain, palpitations and leg swelling.  Neurological: Negative for dizziness, weakness and light-headedness.      Objective:    BP 120/78   Pulse 64   Temp 97.5 F (36.4 C)   Wt 184 lb (83.5 kg)   SpO2 97%   BMI 27.17 kg/m  Nursing note and vital signs reviewed.  Physical Exam  Constitutional: He is oriented to person, place, and time. He appears well-developed and well-nourished. No distress.  Cardiovascular: Normal rate, regular rhythm, normal heart sounds and intact distal pulses.   Pulmonary/Chest: Effort normal and breath sounds normal.  Musculoskeletal:  Left hand - no obvious deformity, discoloration, or edema noted. Mild tenderness and mass noted just proximal to fourth MCP joint. Range of motion and strength are within normal limits. Capillary refill and pulses are intact and appropriate  Neurological: He is alert and oriented to person, place, and time.  Skin: Skin is warm and dry.  Psychiatric: He  has a normal mood and affect. His behavior is normal. Judgment and thought content normal.   Examination: Limited Ultrasound of the Left Hand Date:  04/30/2016 Patient Name: Brandon Colon History: MIld tenderness of left 4th finger with small cyst/lesion Findings:  Flexor and extensor tendons of 4th finger are intact and without evidence of injury. There is a cyst located on the palmar aspect proximal to the MCP joint that appears hypoechoic. Volar plates, MCP, PIP and DIP are normal.   Impression: Cyst/lesion of the left 4th finger       All images are located under the media  tab. [place] ordered, performed and interpreted by Terri Piedra, [name]  Procedure Fine needle aspiration   Associated risks of procedure including risks associated with not performing the procedure were discussed in detail. Patient wished to continue with verbal agreement. Time out was performed.  Hand was placed in a supinated position and the left 4th finger was identified and marked. The site was cleansed with betadine solution and allowed to dry. Cold spray was applied to the site and sensoricane was used for local anesthesia. A 21 gauge needle was used to remove fluid from the cyst. The procedure was completed without complication and tolerated well by the patient. Post care instructions and return symptoms discussed.     Assessment & Plan:   Problem List Items Addressed This Visit      Cardiovascular and Mediastinum   Essential hypertension - Primary    Blood pressure appears adequate control below goal 140/90 with current regimen and no adverse side effects. Continue current dosage of clonidine and lisinopril. Encouraged to monitor blood pressure, continue to follow low-sodium diet. Denies symptoms of end organ damage or worse headache of life. Continue to monitor.        Other   Mass of finger    Small mass/cyst of right fourth finger aspirated without complication. Post-care injection provided and follow-up if symptoms worsen or do not improve.      Relevant Orders   [place]    Other Visit Diagnoses   None.     I am having Mr. Chesbro maintain his cloNIDine and lisinopril.   Follow-up: Return in about 6 months (around 10/29/2016).  Mauricio Po, [name]

## 2016-04-30 NOTE — Progress Notes (Signed)
Pre visit review using our clinic review tool, if applicable. No additional management support is needed unless otherwise documented below in the visit note. 

## 2016-06-23 ENCOUNTER — Encounter: Payer: Self-pay | Admitting: Gastroenterology

## 2016-06-23 ENCOUNTER — Ambulatory Visit (AMBULATORY_SURGERY_CENTER): Payer: Self-pay | Admitting: *Deleted

## 2016-06-23 VITALS — Ht 69.0 in | Wt 195.0 lb

## 2016-06-23 DIAGNOSIS — Z1211 Encounter for screening for malignant neoplasm of colon: Secondary | ICD-10-CM

## 2016-06-23 MED ORDER — NA SULFATE-K SULFATE-MG SULF 17.5-3.13-1.6 GM/177ML PO SOLN: 1.0000 | Freq: Once | ORAL | 0 refills | Status: AC

## 2016-06-23 NOTE — Progress Notes (Signed)
No egg or soy allergy known to patient  No issues with past sedation with any surgeries  or procedures, no intubation problems  No diet pills per patient No home 02 use per patient  No blood thinners per patient  Pt denies issues with constipation  No A fib or A flutter   emmi declined  I discussed prep instructions with pt several times in PV this morning to make sure he understood - he verbalized understanding of these instructions

## 2016-07-02 IMAGING — CR DG KNEE AP/LAT W/ SUNRISE*L*
4 series · 4 of 4 positions shown · non-contrast
Comparison: None.

CLINICAL DATA: Left knee pain for several months.

EXAM:
DG KNEE - 3 VIEWS

[knee ap]
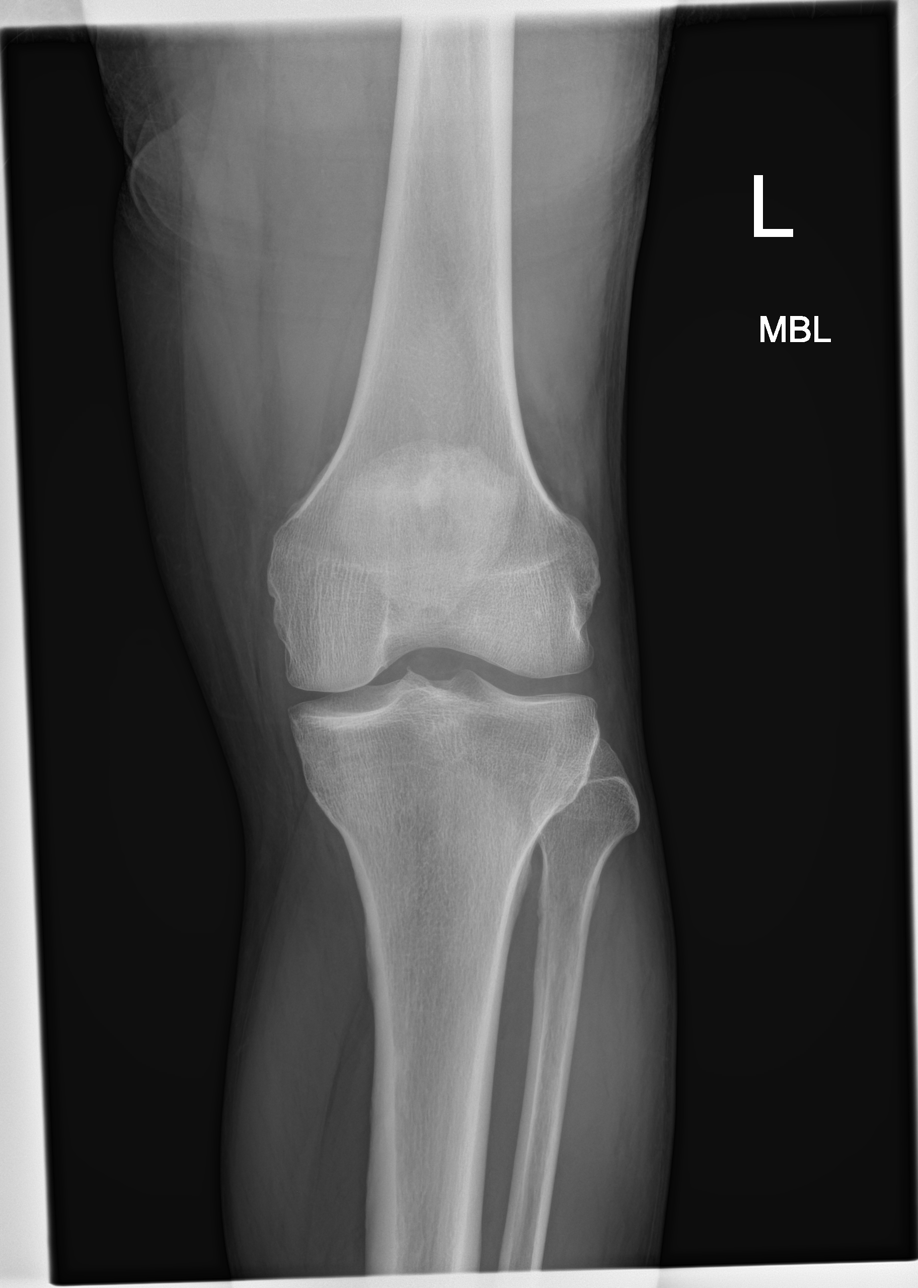

[knee lat (1 of 2)]
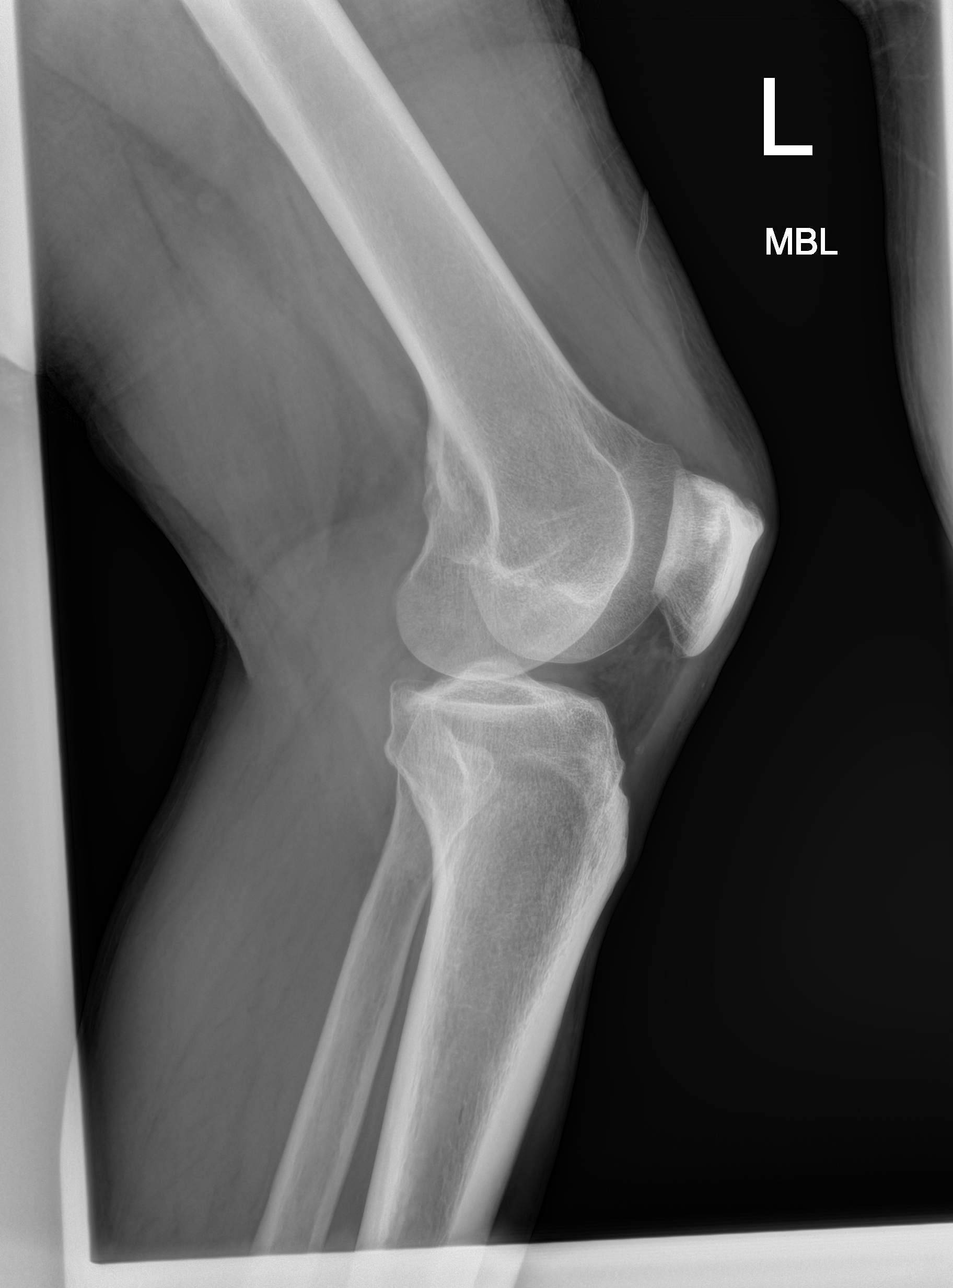

[knee axial]
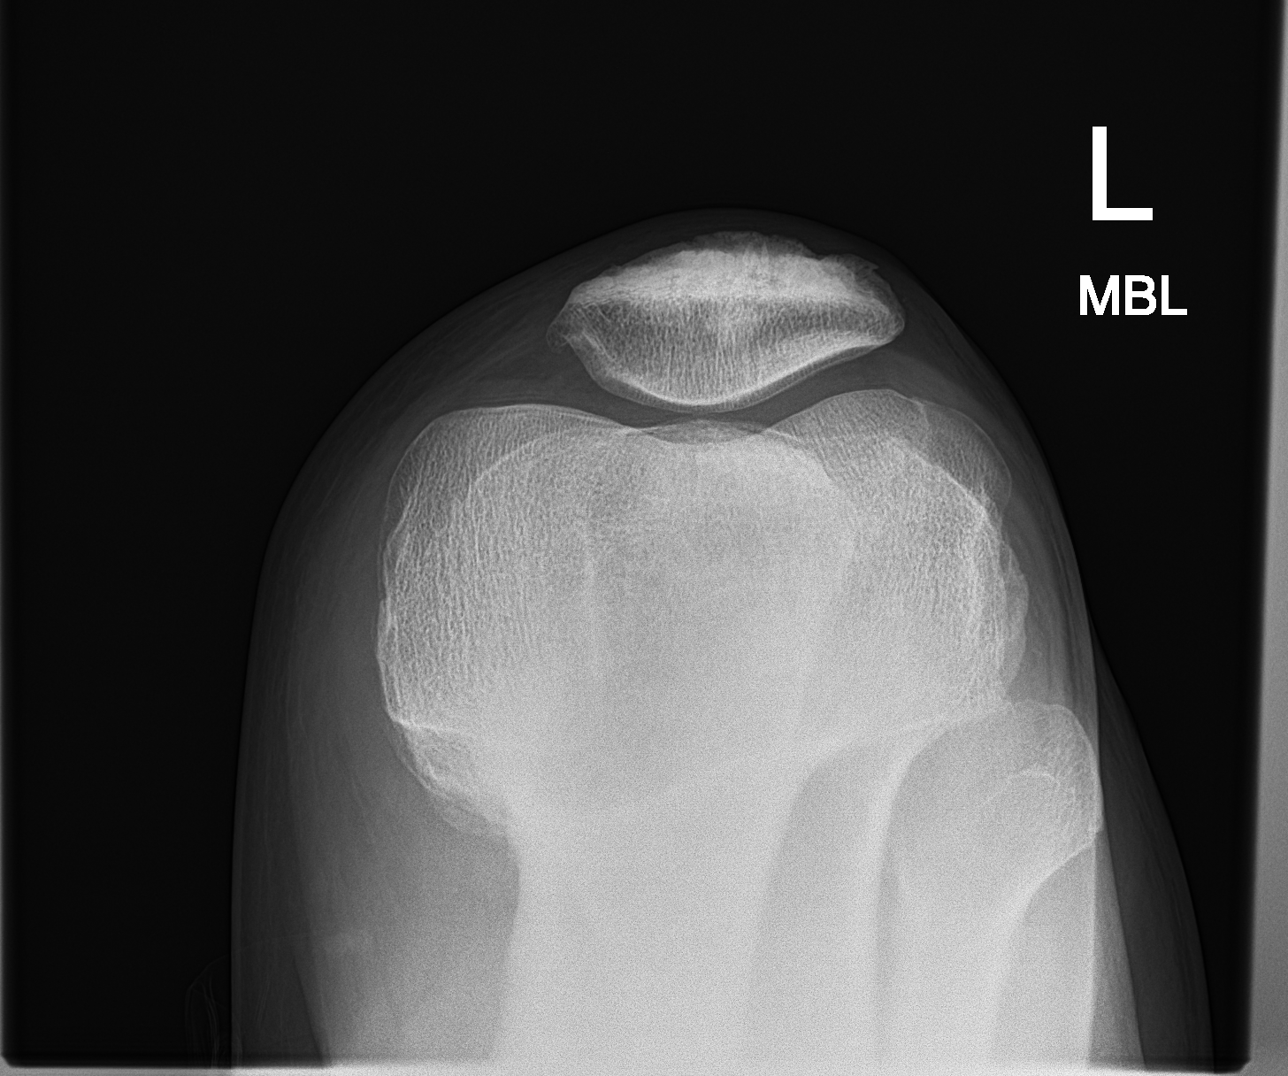

[knee lat (2 of 2)]
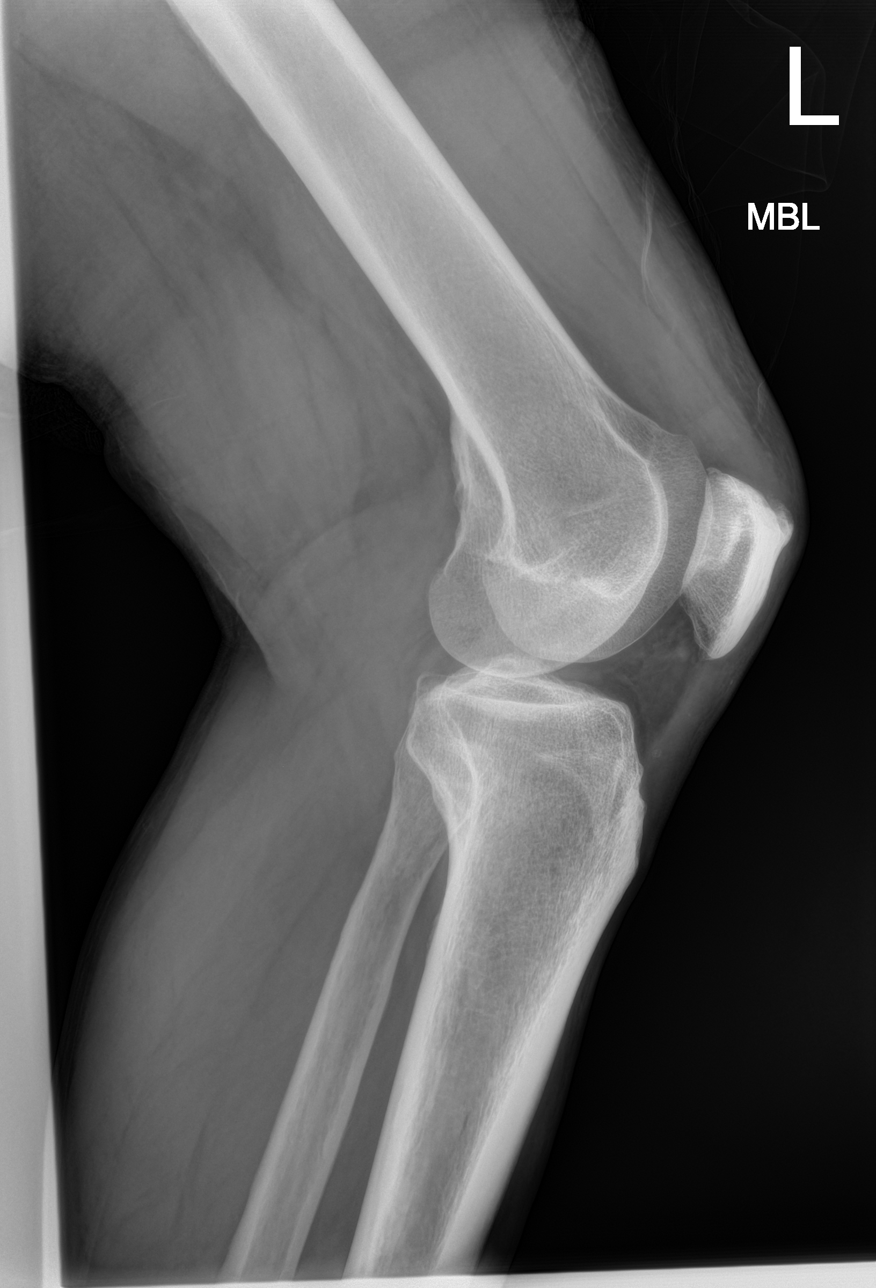

[4 of 4 positions shown; findings below may reference images not displayed]

FINDINGS: There is no evidence of fracture, dislocation, or joint effusion.
Mild patellofemoral and medial compartment narrowing noted. There is
sharpening of the tibial spines.
IMPRESSION: 1. No acute findings.
2. Mild osteoarthritis.

## 2016-07-03 ENCOUNTER — Ambulatory Visit (AMBULATORY_SURGERY_CENTER): Payer: BC Managed Care – PPO | Admitting: Gastroenterology

## 2016-07-03 ENCOUNTER — Encounter: Payer: Self-pay | Admitting: Gastroenterology

## 2016-07-03 VITALS — BP 132/92 | HR 66 | Temp 96.2°F | Resp 11 | Ht 69.0 in | Wt 195.0 lb

## 2016-07-03 DIAGNOSIS — D123 Benign neoplasm of transverse colon: Secondary | ICD-10-CM | POA: Diagnosis not present

## 2016-07-03 DIAGNOSIS — K635 Polyp of colon: Secondary | ICD-10-CM

## 2016-07-03 DIAGNOSIS — D125 Benign neoplasm of sigmoid colon: Secondary | ICD-10-CM | POA: Diagnosis not present

## 2016-07-03 DIAGNOSIS — D126 Benign neoplasm of colon, unspecified: Secondary | ICD-10-CM | POA: Diagnosis not present

## 2016-07-03 DIAGNOSIS — Z1212 Encounter for screening for malignant neoplasm of rectum: Secondary | ICD-10-CM

## 2016-07-03 DIAGNOSIS — Z1211 Encounter for screening for malignant neoplasm of colon: Secondary | ICD-10-CM

## 2016-07-03 MED ORDER — SODIUM CHLORIDE 0.9 % IV SOLN: 500.0000 mL | INTRAVENOUS | Status: DC

## 2016-07-03 NOTE — Op Note (Signed)
[facility] Patient Name: Brandon Colon Procedure Date: 07/03/2016 10:05 AM MRN: [id number] Endoscopist: Ladene Artist , MD Age: 60 Referring MD:  Date of Birth: 23-Sep-1955 Gender: Male Account #: [id number] Procedure:                Colonoscopy Indications:              Screening for colorectal malignant neoplasm Medicines:                Monitored Anesthesia Care Procedure:                Pre-Anesthesia Assessment:                           - Prior to the procedure, a History and Physical                            was performed, and patient medications and                            allergies were reviewed. The patient's tolerance of                            previous anesthesia was also reviewed. The risks                            and benefits of the procedure and the sedation                            options and risks were discussed with the patient.                            All questions were answered, and informed consent                            was obtained. Prior Anticoagulants: The patient has                            taken no previous anticoagulant or antiplatelet                            agents. ASA Grade Assessment: II - A patient with                            mild systemic disease. After reviewing the risks                            and benefits, the patient was deemed in                            satisfactory condition to undergo the procedure.                           After obtaining informed consent, the colonoscope  was passed under direct vision. Throughout the                            procedure, the patient's blood pressure, pulse, and                            oxygen saturations were monitored continuously. The                            Model PCF-[id number]) scope was introduced                            through the anus and advanced to the the cecum,                            identified by  appendiceal orifice and ileocecal                            valve. The ileocecal valve, appendiceal orifice,                            and rectum were photographed. The quality of the                            bowel preparation was good. The colonoscopy was                            performed without difficulty. The patient tolerated                            the procedure well. Scope In: 10:15:46 AM Scope Out: 10:31:14 AM Scope Withdrawal Time: 0 hours 13 minutes 34 seconds  Total Procedure Duration: 0 hours 15 minutes 28 seconds  Findings:                 The perianal and digital rectal examinations were                            normal.                           A 3 mm polyp was found in the transverse colon. The                            polyp was sessile. The polyp was removed with a                            cold biopsy forceps. Resection and retrieval were                            complete.                           Four sessile polyps were found in the sigmoid colon                            (  3) and transverse colon (1). The polyps were 5 to                            6 mm in size. These polyps were removed with a cold                            snare. Resection and retrieval were complete.                           The exam was otherwise without abnormality on                            direct and retroflexion views. Complications:            No immediate complications. Estimated blood loss:                            None. Estimated Blood Loss:     Estimated blood loss: none. Impression:               - One 3 mm polyp in the transverse colon, removed                            with a cold biopsy forceps. Resected and retrieved.                           - Four 5 to 6 mm polyps in the sigmoid colon and in                            the transverse colon, removed with a cold snare.                            Resected and retrieved.                           - The  examination was otherwise normal on direct                            and retroflexion views. Recommendation:           - Repeat colonoscopy in 5 years for surveillance if                            polyp(s) are precancerous, otherwise 10 years.                           - Patient has a contact number available for                            emergencies. The signs and symptoms of potential                            delayed complications were discussed with the  patient. Return to normal activities tomorrow.                            Written discharge instructions were provided to the                            patient.                           - Resume previous diet.                           - Continue present medications.                           - Await pathology results. Ladene Artist, MD 07/03/2016 10:37:14 AM This report has been signed electronically.

## 2016-07-03 NOTE — Patient Instructions (Signed)
YOU HAD AN ENDOSCOPIC PROCEDURE TODAY AT [facility]:   Refer to the procedure report that was given to you for any specific questions about what was found during the examination.  If the procedure report does not answer your questions, please call your gastroenterologist to clarify.  If you requested that your care partner not be given the details of your procedure findings, then the procedure report has been included in a sealed envelope for you to review at your convenience later.  YOU SHOULD EXPECT: Some feelings of bloating in the abdomen. Passage of more gas than usual.  Walking can help get rid of the air that was put into your GI tract during the procedure and reduce the bloating. If you had a lower endoscopy (such as a colonoscopy or flexible sigmoidoscopy) you may notice spotting of blood in your stool or on the toilet paper. If you underwent a bowel prep for your procedure, you may not have a normal bowel movement for a few days.  Please Note:  You might notice some irritation and congestion in your nose or some drainage.  This is from the oxygen used during your procedure.  There is no need for concern and it should clear up in a day or so.  SYMPTOMS TO REPORT IMMEDIATELY:   Following lower endoscopy (colonoscopy or flexible sigmoidoscopy):  Excessive amounts of blood in the stool  Significant tenderness or worsening of abdominal pains  Swelling of the abdomen that is new, acute  Fever of 100F or higher  For urgent or emergent issues, a gastroenterologist can be reached at any hour by calling [phone].   Please read all handouts given to you by your recovery nurse.  DIET:  We do recommend a small meal at first, but then you may proceed to your regular diet.  Drink plenty of fluids but you should avoid alcoholic beverages for 24 hours.  ACTIVITY:  You should plan to take it easy for the rest of today and you should NOT DRIVE or use heavy machinery until  tomorrow (because of the sedation medicines used during the test).    FOLLOW UP: Our staff will call the number listed on your records the next business day following your procedure to check on you and address any questions or concerns that you may have regarding the information given to you following your procedure. If we do not reach you, we will leave a message.  However, if you are feeling well and you are not experiencing any problems, there is no need to return our call.  We will assume that you have returned to your regular daily activities without incident.  If any biopsies were taken you will be contacted by phone or by letter within the next 1-3 weeks.  Please call us at [phone] if you have not heard about the biopsies in 3 weeks.    SIGNATURES/CONFIDENTIALITY: You and/or your care partner have signed paperwork which will be entered into your electronic medical record.  These signatures attest to the fact that that the information above on your After Visit Summary has been reviewed and is understood.  Full responsibility of the confidentiality of this discharge information lies with you and/or your care-partner.  Thanks for letting us take care of your healthcare needs today.

## 2016-07-03 NOTE — Progress Notes (Signed)
Report to PACU, RN, vss, BBS= Clear.  

## 2016-07-03 NOTE — Progress Notes (Signed)
Called to room to assist during endoscopic procedure.  Patient ID and intended procedure confirmed with present staff. Received instructions for my participation in the procedure from the performing physician.  

## 2016-07-07 ENCOUNTER — Encounter: Payer: BC Managed Care – PPO | Admitting: Gastroenterology

## 2016-07-07 ENCOUNTER — Telehealth: Payer: Self-pay

## 2016-07-07 NOTE — Telephone Encounter (Signed)
  Follow up Call-  Call back number 07/03/2016  Post procedure Call Back phone  # [phone]  Permission to leave phone message Yes  Some recent data might be hidden    Patient was called for follow up after his procedure on 07/03/2016. I spoke to the patients wife and she reports that Brandon Colon has returned to his normal daily activities without any complications.

## 2016-07-13 ENCOUNTER — Encounter: Payer: Self-pay | Admitting: Gastroenterology

## 2016-09-07 ENCOUNTER — Other Ambulatory Visit: Payer: Self-pay | Admitting: Family

## 2016-11-02 ENCOUNTER — Ambulatory Visit (INDEPENDENT_AMBULATORY_CARE_PROVIDER_SITE_OTHER): Payer: BC Managed Care – PPO | Admitting: Family

## 2016-11-02 ENCOUNTER — Encounter: Payer: Self-pay | Admitting: Family

## 2016-11-02 ENCOUNTER — Ambulatory Visit (INDEPENDENT_AMBULATORY_CARE_PROVIDER_SITE_OTHER)
Admission: RE | Admit: 2016-11-02 | Discharge: 2016-11-02 | Disposition: A | Discharge status: Home / Self Care | Payer: BC Managed Care – PPO | Source: Ambulatory Visit | Attending: Family | Admitting: Family

## 2016-11-02 DIAGNOSIS — M25561 Pain in right knee: Secondary | ICD-10-CM | POA: Diagnosis not present

## 2016-11-02 MED ORDER — IBUPROFEN-FAMOTIDINE 800-26.6 MG PO TABS: 1.0000 | ORAL_TABLET | Freq: Three times a day (TID) | ORAL | 1 refills | Status: DC | PRN

## 2016-11-02 MED ORDER — DICLOFENAC SODIUM 2 % TD SOLN: 1.0000 "application " | Freq: Two times a day (BID) | TRANSDERMAL | 1 refills | Status: DC | PRN

## 2016-11-02 NOTE — Progress Notes (Signed)
Subjective:    Patient ID: Brandon Colon, male    DOB: [birth year], 61 y.o.   MRN: [id number]  Chief Complaint  Patient presents with  . Knee Pain    right knee pain x1 month    HPI:  Brandon Colon is Colon 61 y.o. male who  has Colon past medical history of Allergy; Arthritis; Avascular necrosis ([facility]); Difficulty sleeping; HTN (hypertension); and TIA (transient ischemic attack) (2005). and presents today for an acute office visit.   This is Colon new problem. Associated symptoms of pain located in his right knee has been going on for about 1 month and worsening about for the last couple of weeks. Denies any trauma or injury. Describes walking on concrete and stairs on Colon regular basis and notes that his knee has increased soreness. Modifying factors include Tylenol arthritis which helps Colon little. Pains are described as sharp and stiffness in the morning. Pain is primarily on the inside of his knee. 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   No Known Allergies    Outpatient Medications Prior to Visit  Medication Sig Dispense Refill  . cloNIDine (CATAPRES) 0.2 MG tablet Take 1 tablet (0.2 mg total) by mouth at bedtime. 90 tablet 1  . lisinopril (PRINIVIL,ZESTRIL) 40 MG tablet TAKE 1 TABLET(40 MG) BY MOUTH DAILY 90 tablet 0   Facility-Administered Medications Prior to Visit  Medication Dose Route Frequency Provider Last Rate Last Dose  . 0.9 %  sodium chloride infusion  500 mL Intravenous Continuous Brandon Artist, MD          Past Surgical History:  Procedure Laterality Date  . FINGER SURGERY  1989   RT INDEX FINGER  . TOTAL KNEE ARTHROPLASTY Left 01/08/2015   Procedure: TOTAL LEFT  KNEE ARTHROPLASTY;  Surgeon: Brandon Cancel, MD;  Location: [facility];  Service: Orthopedics;  Laterality: Left;      Past Medical History:  Diagnosis Date  . Allergy    occ  . Arthritis   . Avascular necrosis ([facility])    LEFT KNEE  . Difficulty sleeping    DUE TO KNEE PAIN  . HTN  (hypertension)   . TIA (transient ischemic attack) 2005     Review of Systems  Constitutional: Negative for chills and fever.  Musculoskeletal:       Positive for right knee pain.   Neurological: Negative for weakness and numbness.      Objective:    BP (!) 160/100 (BP Location: Left Arm, Patient Position: Sitting, Cuff Size: Large)   Pulse 80   Temp 98.4 F (36.9 C) (Oral)   Resp 16   Ht 5\' 9"  (1.753 m)   Wt 199 lb 12.8 oz (90.6 kg)   SpO2 97%   BMI 29.51 kg/m  Nursing note and vital signs reviewed.  Physical Exam  Constitutional: He is oriented to person, place, and time. He appears well-developed and well-nourished. No distress.  Cardiovascular: Normal rate, regular rhythm, normal heart sounds and intact distal pulses.   Pulmonary/Chest: Effort normal and breath sounds normal.  Musculoskeletal:  Right knee - mild edema with no obvious deformity or discoloration. Palpable tenderness along medial joint line with some edema noted. Range of motion within normal limits. Distal pulses and sensation intact and appropriate. Strength is normal. Negative ligamentous testing. Positive meniscal testing.  Neurological: He is alert and oriented to person, place, and time.  Skin: Skin is warm and dry.  Psychiatric: He has Colon normal mood and affect.  His behavior is normal. Judgment and thought content normal.       Assessment & Plan:   Problem List Items Addressed This Visit      Other   Right knee pain    New-onset acute right knee pain with no significant trauma most likely associated with osteoarthritis versus possible meniscal tear. Obtain x-rays. Start Pennsaid and Duexis. Start icing regimen and home exercise therapy. Consider cortisone injection if symptoms worsen or do not improve.      Relevant Medications   Ibuprofen-Famotidine 800-26.6 MG TABS   Diclofenac Sodium (PENNSAID) 2 % SOLN   Other Relevant Orders   DG Knee Complete 4 Views Right       I am having [name] start on Ibuprofen-Famotidine and Diclofenac Sodium. I am also having him maintain his cloNIDine and lisinopril. We will continue to administer sodium chloride.   Meds ordered this encounter  Medications  . Ibuprofen-Famotidine 800-26.6 MG TABS    Sig: Take 1 tablet by mouth 3 (three) times daily as needed.    Dispense:  90 tablet    Refill:  1    Order Specific Question:   Supervising Provider    Answer:   Brandon Colon [[id number]]  . Diclofenac Sodium (PENNSAID) 2 % SOLN    Sig: Place 1 application onto the skin 2 (two) times daily as needed.    Dispense:  112 g    Refill:  1    Order Specific Question:   Supervising Provider    Answer:   Brandon Colon [[id number]]     Follow-up: Return in about 3 weeks (around 11/23/2016), or if symptoms worsen or fail to improve.   Brandon Po, [name]

## 2016-11-02 NOTE — Patient Instructions (Addendum)
Thank you for choosing [facility]!  Icet x 20 minutes every 2 hours and as needed or following activity and after work.   Exercises 1-2 times per day as instructed.   Pending x-ray results we may consider a cortisone injection.   Medications  Pennsaid - Approximately 1/2 packet to the affected site twice daily.  Duexis - 1 tablet 3 times per day for the next 5-7 days and then as needed.  You will receive a call from [facility] regarding your Pennsaid/Duexis/Vimovo. The medication will be mailed to you and should cost you no more than $10 per item or possibly free depending upon your insurance.   Your prescription(s) have been submitted to your pharmacy or been printed and provided for you. Please take as directed and contact our office if you believe you are having problem(s) with the medication(s) or have any questions.  Imaging  Please stop by radiology on the basement level of the building for your x-rays. Your results will be released to [facility] (or called to you) after review, usually within 72 hours after test completion. If any treatments or changes are necessary, you will be notified at that same time.  If your symptoms worsen or fail to improve, please contact our office for further instruction, or in case of emergency go directly to the emergency room at the closest medical facility.    Osteoarthritis Osteoarthritis is a type of arthritis that affects tissue that covers the ends of bones in joints (cartilage). Cartilage acts as a cushion between the bones and helps them move smoothly. Osteoarthritis results when cartilage in the joints gets worn down. Osteoarthritis is sometimes called "wear and tear" arthritis. Osteoarthritis is the most common form of arthritis. It often occurs in older people. It is a condition that gets worse over time (a progressive condition). Joints that are most often affected by this condition are  in:  Fingers.  Toes.  Hips.  Knees.  Spine, including neck and lower back. What are the causes? This condition is caused by age-related wearing down of cartilage that covers the ends of bones. What increases the risk? The following factors may make you more likely to develop this condition:  Older age.  Being overweight or obese.  Overuse of joints, such as in athletes.  Past injury of a joint.  Past surgery on a joint.  Family history of osteoarthritis. What are the signs or symptoms? The main symptoms of this condition are pain, swelling, and stiffness in the joint. The joint may lose its shape over time. Small pieces of bone or cartilage may break off and float inside of the joint, which may cause more pain and damage to the joint. Small deposits of bone (osteophytes) may grow on the edges of the joint. Other symptoms may include:  A grating or scraping feeling inside the joint when you move it.  Popping or creaking sounds when you move. Symptoms may affect one or more joints. Osteoarthritis in a major joint, such as your knee or hip, can make it painful to walk or exercise. If you have osteoarthritis in your hands, you might not be able to grip items, twist your hand, or control small movements of your hands and fingers (fine motor skills). How is this diagnosed? This condition may be diagnosed based on:  Your medical history.  A physical exam.  Your symptoms.  X-rays of the affected joint(s).  Blood tests to rule out other types of arthritis. How is this treated? There  is no cure for this condition, but treatment can help to control pain and improve joint function. Treatment plans may include:  A prescribed exercise program that allows for rest and joint relief. You may work with a physical therapist.  A weight control plan.  Pain relief techniques, such as:  Applying heat and cold to the joint.  Electric pulses delivered to nerve endings under the skin  (transcutaneous electrical nerve stimulation, or TENS).  Massage.  Certain nutritional supplements.  NSAIDs or prescription medicines to help relieve pain.  Medicine to help relieve pain and inflammation (corticosteroids). This can be given by mouth (orally) or as an injection.  Assistive devices, such as a brace, wrap, splint, specialized glove, or cane.  Surgery, such as:  An osteotomy. This is done to reposition the bones and relieve pain or to remove loose pieces of bone and cartilage.  Joint replacement surgery. You may need this surgery if you have very bad (advanced) osteoarthritis. Follow these instructions at home: Activity   Rest your affected joints as directed by your health care provider.  Do not drive or use heavy machinery while taking prescription pain medicine.  Exercise as directed. Your health care provider or physical therapist may recommend specific types of exercise, such as:  Strengthening exercises. These are done to strengthen the muscles that support joints that are affected by arthritis. They can be performed with weights or with exercise bands to add resistance.  Aerobic activities. These are exercises, such as brisk walking or water aerobics, that get your heart pumping.  Range-of-motion activities. These keep your joints easy to move.  Balance and agility exercises. Managing pain, stiffness, and swelling   If directed, apply heat to the affected area as often as told by your health care provider. Use the heat source that your health care provider recommends, such as a moist heat pack or a heating pad.  If you have a removable assistive device, remove it as told by your health care provider.  Place a towel between your skin and the heat source. If your health care provider tells you to keep the assistive device on while you apply heat, place a towel between the assistive device and the heat source.  Leave the heat on for 20-30 minutes.  Remove  the heat if your skin turns bright red. This is especially important if you are unable to feel pain, heat, or cold. You may have a greater risk of getting burned.  If directed, put ice on the affected joint:  If you have a removable assistive device, remove it as told by your health care provider.  Put ice in a plastic bag.  Place a towel between your skin and the bag. If your health care provider tells you to keep the assistive device on during icing, place a towel between the assistive device and the bag.  Leave the ice on for 20 minutes, 2-3 times a day. General instructions   Take over-the-counter and prescription medicines only as told by your health care provider.  Maintain a healthy weight. Follow instructions from your health care provider for weight control. These may include dietary restrictions.  Do not use any products that contain nicotine or tobacco, such as cigarettes and e-cigarettes. These can delay bone healing. If you need help quitting, ask your health care provider.  Use assistive devices as directed by your health care provider.  Keep all follow-up visits as told by your health care provider. This is important. Where to find  more information:  [employer] of Arthritis and Musculoskeletal and Skin Diseases: www.niams.SouthExposed.es  [employer] on Aging: http://kim-miller.com/  American College of Rheumatology: www.rheumatology.org Contact a health care provider if:  Your skin turns red.  You develop a rash.  You have pain that gets worse.  You have a fever along with joint or muscle aches. Get help right away if:  You lose a lot of weight.  You suddenly lose your appetite.  You have night sweats. Summary  Osteoarthritis is a type of arthritis that affects tissue covering the ends of bones in joints (cartilage).  This condition is caused by age-related wearing down of cartilage that covers the ends of bones.  The main symptom of this condition  is pain, swelling, and stiffness in the joint.  There is no cure for this condition, but treatment can help to control pain and improve joint function. This information is not intended to replace advice given to you by your health care provider. Make sure you discuss any questions you have with your health care provider. Document Released: 06/22/2005 Document Revised: 02/24/2016 Document Reviewed: 02/24/2016 Elsevier Interactive Patient Education  2017 [employer].

## 2016-11-02 NOTE — Assessment & Plan Note (Signed)
New-onset acute right knee pain with no significant trauma most likely associated with osteoarthritis versus possible meniscal tear. Obtain x-rays. Start Pennsaid and Duexis. Start icing regimen and home exercise therapy. Consider cortisone injection if symptoms worsen or do not improve.

## 2016-11-09 ENCOUNTER — Telehealth: Payer: Self-pay | Admitting: Family

## 2016-11-09 NOTE — Telephone Encounter (Signed)
Tried calling pt back on home phone. No answer and no VM.

## 2016-11-09 NOTE — Telephone Encounter (Signed)
Pt would like call back from his XRay results from 4/30 and would like to know what [facility] would like to do for his pain.   Call home number

## 2016-11-11 NOTE — Telephone Encounter (Signed)
Tried calling pt back in regards. No answer and no VM 

## 2016-12-10 ENCOUNTER — Ambulatory Visit (INDEPENDENT_AMBULATORY_CARE_PROVIDER_SITE_OTHER): Payer: BC Managed Care – PPO | Admitting: Family

## 2016-12-10 ENCOUNTER — Encounter: Payer: Self-pay | Admitting: Family

## 2016-12-10 VITALS — BP 162/108 | HR 74 | Temp 98.4°F | Resp 18 | Ht 69.0 in | Wt 198.0 lb

## 2016-12-10 DIAGNOSIS — M25561 Pain in right knee: Secondary | ICD-10-CM | POA: Diagnosis not present

## 2016-12-10 NOTE — Assessment & Plan Note (Signed)
Right knee pain with mild osteoarthritis and concern for possible meniscal pathology. Injection of corticosteroid provided with no complication and tolerated well. Continue icing regimen and home exercise therapy. If symptoms worsen consider referral to orthopedics and possible MRI.

## 2016-12-10 NOTE — Patient Instructions (Signed)
Thank you for choosing [facility].  SUMMARY AND INSTRUCTIONS:  Take it easy for the next 24 hours.  Monitor for signs of infection.  Ice x 20 minutes every 2 hours and as needed.   Stretches and exercises daily.   If your symptoms do not improve we will send you to orthopedics.  Follow up:  If your symptoms worsen or fail to improve, please contact our office for further instruction, or in case of emergency go directly to the emergency room at the closest medical facility.

## 2016-12-10 NOTE — Progress Notes (Signed)
Subjective:    Patient ID: Brandon Colon, male    DOB: [birth year], 61 y.o.   MRN: [id number]  Chief Complaint  Patient presents with  . Knee Pain    was seen for his right knee last time he was here and the pain has gotten worse, the pennsaid works for a very short time    HPI:  Brandon Colon is a 61 y.o. male who  has a past medical history of Allergy; Arthritis; Avascular necrosis ([place]); Difficulty sleeping; HTN (hypertension); and TIA (transient ischemic attack) (2005). and presents today for an office visit.   1.) Right knee - Continues to experience the associated symptom of pain located in his right knee that has been refractory to Pennsaid and Duexis. Reports taking the medication as prescribed and denies adverse side effects. Symptoms are poorly controlled and feels like it is worsening and has changed his gait secondary to the pain. Previous x-rays showed mild degenerative change without acute abnormality.     No Known Allergies    Outpatient Medications Prior to Visit  Medication Sig Dispense Refill  . cloNIDine (CATAPRES) 0.2 MG tablet Take 1 tablet (0.2 mg total) by mouth at bedtime. 90 tablet 1  . Diclofenac Sodium (PENNSAID) 2 % SOLN Place 1 application onto the skin 2 (two) times daily as needed. 112 g 1  . Ibuprofen-Famotidine 800-26.6 MG TABS Take 1 tablet by mouth 3 (three) times daily as needed. 90 tablet 1  . lisinopril (PRINIVIL,ZESTRIL) 40 MG tablet TAKE 1 TABLET(40 MG) BY MOUTH DAILY 90 tablet 0   Facility-Administered Medications Prior to Visit  Medication Dose Route Frequency Provider Last Rate Last Dose  . 0.9 %  sodium chloride infusion  500 mL Intravenous Continuous Ladene Artist, MD          Past Medical History:  Diagnosis Date  . Allergy    occ  . Arthritis   . Avascular necrosis ([facility])    LEFT KNEE  . Difficulty sleeping    DUE TO KNEE PAIN  . HTN (hypertension)   . TIA (transient ischemic attack) 2005      Review of Systems    Constitutional: Negative for chills and fever.  Musculoskeletal:       Positive for right knee pain.   Neurological: Negative for weakness and numbness.      Objective:    BP (!) 162/108 (BP Location: Left Arm, Patient Position: Sitting, Cuff Size: Large)   Pulse 74   Temp 98.4 F (36.9 C) (Oral)   Resp 18   Ht 5\' 9"  (1.753 m)   Wt 198 lb (89.8 kg)   SpO2 97%   BMI 29.24 kg/m  Nursing note and vital signs reviewed.  Physical Exam  Constitutional: He is oriented to person, place, and time. He appears well-developed and well-nourished. No distress.  Cardiovascular: Normal rate, regular rhythm, normal heart sounds and intact distal pulses.   Pulmonary/Chest: Effort normal and breath sounds normal.  Musculoskeletal:  Right knee - obvious deformity or discoloration with mild/moderate edema and palpable tenderness along the medial joint line. Range of motion within normal limits. Strength is normal. Distal pulses and sensation are intact and appropriate. Ligamentous testing is negative. Questionable meniscal pathology.  Neurological: He is alert and oriented to person, place, and time.  Skin: Skin is warm and dry.  Psychiatric: He has a normal mood and affect. His behavior is normal. Judgment and thought content normal.     Procedure: Corticosteroid injection of the  right knee  Description: Informed consent was obtained with discussion including risks and benefits of the procedure. Patient verbally wished to continued. Time out was performed. The anterior-lateral site was identified and marked.. It was cleansed with betadine using a concentric circular pattern. Skin anesthesia was applied using cold spray applied for 10 seconds. Injection of 39ml :4 ml of Kenalog (40 mg / ml) to Sensoricaine was injected following aspiration with no return noted. Following the injection there was instant relief confirming appropriate placement. A bandage was applied to the area and post care instructions  were provided. The procedure was tolerated well with no complications.       Assessment & Plan:   Problem List Items Addressed This Visit      Other   Right knee pain - Primary    Right knee pain with mild osteoarthritis and concern for possible meniscal pathology. Injection of corticosteroid provided with no complication and tolerated well. Continue icing regimen and home exercise therapy. If symptoms worsen consider referral to orthopedics and possible MRI.           I am having Mr. Milledge maintain his cloNIDine, lisinopril, Ibuprofen-Famotidine, and Diclofenac Sodium. We will continue to administer sodium chloride.    Follow-up: Return in about 1 month (around 01/09/2017).  Mauricio Po, [name]

## 2016-12-11 ENCOUNTER — Other Ambulatory Visit: Payer: Self-pay | Admitting: Family

## 2016-12-11 DIAGNOSIS — M25561 Pain in right knee: Secondary | ICD-10-CM

## 2016-12-15 ENCOUNTER — Telehealth: Payer: Self-pay | Admitting: Family

## 2016-12-15 DIAGNOSIS — M25561 Pain in right knee: Secondary | ICD-10-CM

## 2016-12-15 NOTE — Telephone Encounter (Signed)
Patient states injection is not working.  Would like to know if Brandon Colon does flexoginix?   Also would like to know if Brandon Colon wants to refer him back to [facility].

## 2016-12-15 NOTE — Telephone Encounter (Signed)
Referral placed.

## 2016-12-16 NOTE — Telephone Encounter (Signed)
Patient does know that referral should take about two weeks. I have asked patient to follow back up with our office if he does not hear from [facility] in two weeks to schedule appt

## 2017-01-07 ENCOUNTER — Other Ambulatory Visit: Payer: Self-pay | Admitting: *Deleted

## 2017-01-07 MED ORDER — LISINOPRIL 40 MG PO TABS: ORAL_TABLET | ORAL | 0 refills | Status: DC

## 2017-01-07 NOTE — Telephone Encounter (Signed)
Rec'd call pt states he is needing refill on his BP med. Verified chart pt is up-to-date inform will send refill to [facility] for his Lisinopril. rx sent electronically...Brandon Colon

## 2017-01-21 ENCOUNTER — Encounter: Payer: Self-pay | Admitting: Family

## 2017-01-21 ENCOUNTER — Telehealth: Payer: Self-pay | Admitting: Family

## 2017-01-21 NOTE — Telephone Encounter (Signed)
[place] called in in  [place] number [phone] Fax number [phone]  They have additional question , what was it prescribe for and has she tried and failed    pensaid duexis

## 2017-01-26 NOTE — Telephone Encounter (Signed)
PA has been faxed.

## 2017-02-11 ENCOUNTER — Ambulatory Visit (INDEPENDENT_AMBULATORY_CARE_PROVIDER_SITE_OTHER): Payer: BC Managed Care – PPO | Admitting: Family

## 2017-02-11 ENCOUNTER — Encounter: Payer: Self-pay | Admitting: Family

## 2017-02-11 VITALS — BP 172/110 | HR 72 | Temp 98.2°F | Resp 16 | Ht 69.0 in | Wt 198.4 lb

## 2017-02-11 DIAGNOSIS — M1711 Unilateral primary osteoarthritis, right knee: Secondary | ICD-10-CM

## 2017-02-11 DIAGNOSIS — M79601 Pain in right arm: Secondary | ICD-10-CM | POA: Diagnosis not present

## 2017-02-11 DIAGNOSIS — I10 Essential (primary) hypertension: Secondary | ICD-10-CM

## 2017-02-11 MED ORDER — LISINOPRIL 40 MG PO TABS: ORAL_TABLET | ORAL | 0 refills | Status: DC

## 2017-02-11 MED ORDER — AMLODIPINE BESYLATE 10 MG PO TABS: 10.0000 mg | ORAL_TABLET | Freq: Every day | ORAL | 3 refills | Status: DC

## 2017-02-11 NOTE — Assessment & Plan Note (Signed)
Symptoms and exam remain consistent with primary osteoarthritis of the right knee is refractory to cortisone injection 2 months ago. Unable to afford orthopedics at this time. Continue conservative treatment with ice, home exercise therapy, and an continue current dosage of due Duexis and Pennsaid. Follow up if symptoms worsen or do not improve.

## 2017-02-11 NOTE — Patient Instructions (Signed)
Thank you for choosing [facility].  SUMMARY AND INSTRUCTIONS:  Please take the Duexis (light blue) 1 tablet twice daily to help with pain and inflammation.  Ice after activity.  Continue to take the clonidine and lisinopril. Start taking amlodipine.  Check your blood pressures at home as able.   Follow up in 1 month.  Medication:  Your prescription(s) have been submitted to your pharmacy or been printed and provided for you. Please take as directed and contact our office if you believe you are having problem(s) with the medication(s) or have any questions.  Follow up:  If your symptoms worsen or fail to improve, please contact our office for further instruction, or in case of emergency go directly to the emergency room at the closest medical facility.     Knee Exercises Ask your health care provider which exercises are safe for you. Do exercises exactly as told by your health care provider and adjust them as directed. It is normal to feel mild stretching, pulling, tightness, or discomfort as you do these exercises, but you should stop right away if you feel sudden pain or your pain gets worse.Do not begin these exercises until told by your health care provider. STRETCHING AND RANGE OF MOTION EXERCISES These exercises warm up your muscles and joints and improve the movement and flexibility of your knee. These exercises also help to relieve pain, numbness, and tingling. Exercise A: Knee Extension, Prone 1. Lie on your abdomen on a bed. 2. Place your left / right knee just beyond the edge of the surface so your knee is not on the bed. You can put a towel under your left / right thigh just above your knee for comfort. 3. Relax your leg muscles and allow gravity to straighten your knee. You should feel a stretch behind your left / right knee. 4. Hold this position for __________ seconds. 5. Scoot up so your knee is supported between repetitions. Repeat __________ times. Complete  this stretch __________ times a day. Exercise B: Knee Flexion, Active  1. Lie on your back with both knees straight. If this causes back discomfort, bend your left / right knee so your foot is flat on the floor. 2. Slowly slide your left / right heel back toward your buttocks until you feel a gentle stretch in the front of your knee or thigh. 3. Hold this position for __________ seconds. 4. Slowly slide your left / right heel back to the starting position. Repeat __________ times. Complete this exercise __________ times a day. Exercise C: Quadriceps, Prone  1. Lie on your abdomen on a firm surface, such as a bed or padded floor. 2. Bend your left / right knee and hold your ankle. If you cannot reach your ankle or pant leg, loop a belt around your foot and grab the belt instead. 3. Gently pull your heel toward your buttocks. Your knee should not slide out to the side. You should feel a stretch in the front of your thigh and knee. 4. Hold this position for __________ seconds. Repeat __________ times. Complete this stretch __________ times a day. Exercise D: Hamstring, Supine 1. Lie on your back. 2. Loop a belt or towel over the ball of your left / right foot. The ball of your foot is on the walking surface, right under your toes. 3. Straighten your left / right knee and slowly pull on the belt to raise your leg until you feel a gentle stretch behind your knee. ? Do not  let your left / right knee bend while you do this. ? Keep your other leg flat on the floor. 4. Hold this position for __________ seconds. Repeat __________ times. Complete this stretch __________ times a day. STRENGTHENING EXERCISES These exercises build strength and endurance in your knee. Endurance is the ability to use your muscles for a long time, even after they get tired. Exercise E: Quadriceps, Isometric  1. Lie on your back with your left / right leg extended and your other knee bent. Put a rolled towel or small pillow  under your knee if told by your health care provider. 2. Slowly tense the muscles in the front of your left / right thigh. You should see your kneecap slide up toward your hip or see increased dimpling just above the knee. This motion will push the back of the knee toward the floor. 3. For __________ seconds, keep the muscle as tight as you can without increasing your pain. 4. Relax the muscles slowly and completely. Repeat __________ times. Complete this exercise __________ times a day. Exercise F: Straight Leg Raises - Quadriceps 1. Lie on your back with your left / right leg extended and your other knee bent. 2. Tense the muscles in the front of your left / right thigh. You should see your kneecap slide up or see increased dimpling just above the knee. Your thigh may even shake a bit. 3. Keep these muscles tight as you raise your leg 4-6 inches (10-15 cm) off the floor. Do not let your knee bend. 4. Hold this position for __________ seconds. 5. Keep these muscles tense as you lower your leg. 6. Relax your muscles slowly and completely after each repetition. Repeat __________ times. Complete this exercise __________ times a day. Exercise G: Hamstring, Isometric 1. Lie on your back on a firm surface. 2. Bend your left / right knee approximately __________ degrees. 3. Dig your left / right heel into the surface as if you are trying to pull it toward your buttocks. Tighten the muscles in the back of your thighs to dig as hard as you can without increasing any pain. 4. Hold this position for __________ seconds. 5. Release the tension gradually and allow your muscles to relax completely for __________ seconds after each repetition. Repeat __________ times. Complete this exercise __________ times a day. Exercise H: Hamstring Curls  If told by your health care provider, do this exercise while wearing ankle weights. Begin with __________ weights. Then increase the weight by 1 lb (0.5 kg) increments.  Do not wear ankle weights that are more than __________. 1. Lie on your abdomen with your legs straight. 2. Bend your left / right knee as far as you can without feeling pain. Keep your hips flat against the floor. 3. Hold this position for __________ seconds. 4. Slowly lower your leg to the starting position.  Repeat __________ times. Complete this exercise __________ times a day. Exercise I: Squats (Quadriceps) 1. Stand in front of a table, with your feet and knees pointing straight ahead. You may rest your hands on the table for balance but not for support. 2. Slowly bend your knees and lower your hips like you are going to sit in a chair. ? Keep your weight over your heels, not over your toes. ? Keep your lower legs upright so they are parallel with the table legs. ? Do not let your hips go lower than your knees. ? Do not bend lower than told by your health care  provider. ? If your knee pain increases, do not bend as low. 3. Hold the squat position for __________ seconds. 4. Slowly push with your legs to return to standing. Do not use your hands to pull yourself to standing. Repeat __________ times. Complete this exercise __________ times a day. Exercise J: Wall Slides (Quadriceps)  1. Lean your back against a smooth wall or door while you walk your feet out 18-24 inches (46-61 cm) from it. 2. Place your feet hip-width apart. 3. Slowly slide down the wall or door until your knees bend __________ degrees. Keep your knees over your heels, not over your toes. Keep your knees in line with your hips. 4. Hold for __________ seconds. Repeat __________ times. Complete this exercise __________ times a day. Exercise K: Straight Leg Raises - Hip Abductors 1. Lie on your side with your left / right leg in the top position. Lie so your head, shoulder, knee, and hip line up. You may bend your bottom knee to help you keep your balance. 2. Roll your hips slightly forward so your hips are stacked  directly over each other and your left / right knee is facing forward. 3. Leading with your heel, lift your top leg 4-6 inches (10-15 cm). You should feel the muscles in your outer hip lifting. ? Do not let your foot drift forward. ? Do not let your knee roll toward the ceiling. 4. Hold this position for __________ seconds. 5. Slowly return your leg to the starting position. 6. Let your muscles relax completely after each repetition. Repeat __________ times. Complete this exercise __________ times a day. Exercise L: Straight Leg Raises - Hip Extensors 1. Lie on your abdomen on a firm surface. You can put a pillow under your hips if that is more comfortable. 2. Tense the muscles in your buttocks and lift your left / right leg about 4-6 inches (10-15 cm). Keep your knee straight as you lift your leg. 3. Hold this position for __________ seconds. 4. Slowly lower your leg to the starting position. 5. Let your leg relax completely after each repetition. Repeat __________ times. Complete this exercise __________ times a day. This information is not intended to replace advice given to you by your health care provider. Make sure you discuss any questions you have with your health care provider. Document Released: 05/06/2005 Document Revised: 03/16/2016 Document Reviewed: 04/28/2015 Elsevier Interactive Patient Education  2018 [employer].

## 2017-02-11 NOTE — Assessment & Plan Note (Signed)
Blood pressure remains poorly controlled with current medication regimen and no adverse side effects and challenged by pain in the right knee. Continue current dosage of lisinopril and clonidine. Start amlodipine. Encouraged to monitor blood pressure at home and follow low-sodium diet. Denies worse headache of life with no new symptoms of end organ damage noted on physical exam. Follow-up in one month or sooner.

## 2017-02-11 NOTE — Progress Notes (Signed)
Subjective:    Patient ID: Brandon Colon, male    DOB: 11/29/1955, 61 y.o.   MRN: [id number]  Chief Complaint  Patient presents with  . Knee Pain    follow up on knee pain, scat form, refill of bp meds 90 day supply     HPI:  Brandon Colon is a 61 y.o. male who  has a past medical history of Allergy; Arthritis; Avascular necrosis ([place]); Difficulty sleeping; HTN (hypertension); and TIA (transient ischemic attack) (2005). and presents today for a follow up office visit.  1.) Hypertension - Currently maintained on for lisinopril  and reports taking the medications as prescribed and denies adverse side effects or hypotensive readings. Does not currently check blood pressure. Denies changes in vision, worst headache of life or new symptoms of end organ damage. Working on following a low sodium diet. Physical activity level   BP Readings from Last 3 Encounters:  02/11/17 (!) 172/110  12/10/16 (!) 162/108  11/02/16 (!) 160/100    2/) Osteoarthritis of Right Knee - Continues to experience the associated symptom of pain located in his right knee that has been refractory to a cortisone injection which helps only for about 1 week and the use of Duexis. He continues to ice following activity. Having difficulty with ambulation and walking longer distances. Knee has less stiffness but continues to experience pain.  3.) Right arm - This is a new problem. Associated symptom of weakness and mild pain going on for a couple of months. Denies any trauma or injury to the area but does work with repetitive motions. Occasional numbness without tingling from the elbow to his fingers. Modifying factors include Duexis which helps a little.  No Known Allergies    Outpatient Medications Prior to Visit  Medication Sig Dispense Refill  . cloNIDine (CATAPRES) 0.2 MG tablet Take 1 tablet (0.2 mg total) by mouth at bedtime. 90 tablet 1  . DUEXIS 800-26.6 MG TABS Take 1 tablet by mouth 3 (three) times daily as  needed. 90 tablet 0  . PENNSAID 2 % SOLN Place 1 application onto the skin 2 (two) times daily as needed. 112 g 0  . lisinopril (PRINIVIL,ZESTRIL) 40 MG tablet TAKE 1 TABLET(40 MG) BY MOUTH DAILY 90 tablet 0   Facility-Administered Medications Prior to Visit  Medication Dose Route Frequency Provider Last Rate Last Dose  . 0.9 %  sodium chloride infusion  500 mL Intravenous Continuous Ladene Artist, MD          Past Surgical History:  Procedure Laterality Date  . FINGER SURGERY  1989   RT INDEX FINGER  . TOTAL KNEE ARTHROPLASTY Left 01/08/2015   Procedure: TOTAL LEFT  KNEE ARTHROPLASTY;  Surgeon: Paralee Cancel, MD;  Location: [facility];  Service: Orthopedics;  Laterality: Left;      Past Medical History:  Diagnosis Date  . Allergy    occ  . Arthritis   . Avascular necrosis ([facility])    LEFT KNEE  . Difficulty sleeping    DUE TO KNEE PAIN  . HTN (hypertension)   . TIA (transient ischemic attack) 2005      Review of Systems  Constitutional: Negative for chills and fever.  Eyes:       Negative for changes in vision  Respiratory: Negative for cough, chest tightness, shortness of breath and wheezing.   Cardiovascular: Negative for chest pain, palpitations and leg swelling.  Musculoskeletal:       Positive for right knee pain and arm pain  Neurological: Negative for dizziness, weakness and light-headedness.      Objective:    BP (!) 172/110 (BP Location: Left Arm, Patient Position: Sitting, Cuff Size: Large)   Pulse 72   Temp 98.2 F (36.8 C) (Oral)   Resp 16   Ht 5\' 9"  (1.753 m)   Wt 198 lb 6.4 oz (90 kg)   SpO2 96%   BMI 29.30 kg/m  Nursing note and vital signs reviewed.  Physical Exam  Constitutional: He is oriented to person, place, and time. He appears well-developed and well-nourished. No distress.  Cardiovascular: Normal rate, regular rhythm, normal heart sounds and intact distal pulses.   Pulmonary/Chest: Effort normal and breath sounds normal.    Musculoskeletal:  Right knee - mild edema with no obvious deformity or discoloration. Palpable tenderness of medial and lateral joint lines with no crepitus or deformity.. Range of motion and strength are normal. Distal pulses and sensation are intact and appropriate. Negative ligamentous and meniscal testing.  Right upper extremity - no obvious deformity, discoloration, or edema. No palpable tenderness able to be elicited. Range of motion and strength are normal. Grip strength is normal. Distal pulses and sensation are intact and appropriate. Negative Phalen's test.  Neurological: He is alert and oriented to person, place, and time.  Skin: Skin is warm and dry.  Psychiatric: He has a normal mood and affect. His behavior is normal. Judgment and thought content normal.       Assessment & Plan:   Problem List Items Addressed This Visit      Cardiovascular and Mediastinum   Essential hypertension - Primary    Blood pressure remains poorly controlled with current medication regimen and no adverse side effects and challenged by pain in the right knee. Continue current dosage of lisinopril and clonidine. Start amlodipine. Encouraged to monitor blood pressure at home and follow low-sodium diet. Denies worse headache of life with no new symptoms of end organ damage noted on physical exam. Follow-up in one month or sooner.      Relevant Medications   amLODipine (NORVASC) 10 MG tablet   lisinopril (PRINIVIL,ZESTRIL) 40 MG tablet     Musculoskeletal and Integument   Primary osteoarthritis    Symptoms and exam remain consistent with primary osteoarthritis of the right knee is refractory to cortisone injection 2 months ago. Unable to afford orthopedics at this time. Continue conservative treatment with ice, home exercise therapy, and an continue current dosage of due Duexis and Pennsaid. Follow up if symptoms worsen or do not improve.         Other   Right arm pain    New onset right arm pain  with no significant findings and unable to reproduce symptoms. Recommend continuing to monitor and conservative treatment with ice and continued Duexis as needed. Follow up if symptoms return.           I am having Mr. Quebedeaux start on amLODipine. I am also having him maintain his cloNIDine, PENNSAID, DUEXIS, and lisinopril. We will continue to administer sodium chloride.   Meds ordered this encounter  Medications  . amLODipine (NORVASC) 10 MG tablet    Sig: Take 1 tablet (10 mg total) by mouth daily.    Dispense:  90 tablet    Refill:  3    Order Specific Question:   Supervising Provider    Answer:   Pricilla Holm A [[id number]]  . lisinopril (PRINIVIL,ZESTRIL) 40 MG tablet    Sig: TAKE 1 TABLET(40 MG) BY MOUTH DAILY  Dispense:  90 tablet    Refill:  0    Order Specific Question:   Supervising Provider    Answer:   Pricilla Holm A [[id number]]     Follow-up: Return in about 1 month (around 03/14/2017), or if symptoms worsen or fail to improve.  Mauricio Po, [name]

## 2017-02-11 NOTE — Assessment & Plan Note (Signed)
New onset right arm pain with no significant findings and unable to reproduce symptoms. Recommend continuing to monitor and conservative treatment with ice and continued Duexis as needed. Follow up if symptoms return.

## 2017-03-05 ENCOUNTER — Other Ambulatory Visit: Payer: Self-pay | Admitting: Family

## 2017-03-10 ENCOUNTER — Other Ambulatory Visit: Payer: Self-pay | Admitting: Family

## 2017-03-10 DIAGNOSIS — M25561 Pain in right knee: Secondary | ICD-10-CM

## 2017-04-29 ENCOUNTER — Other Ambulatory Visit: Payer: Self-pay | Admitting: *Deleted

## 2017-04-29 DIAGNOSIS — M25561 Pain in right knee: Secondary | ICD-10-CM

## 2017-05-14 ENCOUNTER — Ambulatory Visit: Payer: BC Managed Care – PPO | Admitting: Nurse Practitioner

## 2017-05-14 ENCOUNTER — Other Ambulatory Visit (INDEPENDENT_AMBULATORY_CARE_PROVIDER_SITE_OTHER): Payer: BC Managed Care – PPO

## 2017-05-14 ENCOUNTER — Encounter: Payer: Self-pay | Admitting: Nurse Practitioner

## 2017-05-14 VITALS — BP 132/96 | HR 77 | Temp 97.8°F | Ht 69.0 in | Wt 212.0 lb

## 2017-05-14 DIAGNOSIS — I10 Essential (primary) hypertension: Secondary | ICD-10-CM | POA: Diagnosis not present

## 2017-05-14 DIAGNOSIS — M1711 Unilateral primary osteoarthritis, right knee: Secondary | ICD-10-CM

## 2017-05-14 DIAGNOSIS — Z136 Encounter for screening for cardiovascular disorders: Secondary | ICD-10-CM

## 2017-05-14 DIAGNOSIS — Z13220 Encounter for screening for lipoid disorders: Secondary | ICD-10-CM | POA: Diagnosis not present

## 2017-05-14 DIAGNOSIS — R739 Hyperglycemia, unspecified: Secondary | ICD-10-CM

## 2017-05-14 LAB — LIPID PANEL
CHOLESTEROL: 201 mg/dL — AB (ref 0–200)
HDL: 50.6 mg/dL (ref >39.00)
LDL CALC: 125 mg/dL — AB (ref 0–99)
NonHDL: 150.44
TRIGLYCERIDES: 125 mg/dL (ref 0.0–149.0)
Total CHOL/HDL Ratio: 4
VLDL: 25 mg/dL (ref 0.0–40.0)

## 2017-05-14 LAB — BASIC METABOLIC PANEL
BUN: 10 mg/dL (ref 6–23)
CALCIUM: 9.6 mg/dL (ref 8.4–10.5)
CO2: 29 mEq/L (ref 19–32)
Chloride: 103 mEq/L (ref 96–112)
Creatinine, Ser: 0.75 mg/dL (ref 0.40–1.50)
GFR: 135.92 mL/min (ref >60.00)
Glucose, Bld: 91 mg/dL (ref 70–99)
POTASSIUM: 3.8 meq/L (ref 3.5–5.1)
SODIUM: 139 meq/L (ref 135–145)

## 2017-05-14 LAB — HEPATIC FUNCTION PANEL
ALBUMIN: 4.5 g/dL (ref 3.5–5.2)
ALT: 39 U/L (ref 0–53)
AST: 26 U/L (ref 0–37)
Alkaline Phosphatase: 72 U/L (ref 39–117)
Bilirubin, Direct: 0 mg/dL (ref 0.0–0.3)
TOTAL PROTEIN: 7.7 g/dL (ref 6.0–8.3)
Total Bilirubin: 1.9 mg/dL — ABNORMAL HIGH (ref 0.2–1.2)

## 2017-05-14 LAB — HEMOGLOBIN A1C: Hgb A1c MFr Bld: 5 % (ref 4.6–6.5)

## 2017-05-14 MED ORDER — CLONIDINE HCL 0.2 MG PO TABS: ORAL_TABLET | ORAL | 1 refills | Status: DC

## 2017-05-14 MED ORDER — LISINOPRIL 40 MG PO TABS: ORAL_TABLET | ORAL | 1 refills | Status: DC

## 2017-05-14 MED ORDER — AMLODIPINE BESYLATE 10 MG PO TABS: 10.0000 mg | ORAL_TABLET | Freq: Every day | ORAL | 1 refills | Status: DC

## 2017-05-14 NOTE — Patient Instructions (Addendum)
Provided with samples of duexis and pennsaid gel.

## 2017-05-14 NOTE — Progress Notes (Signed)
Subjective:  Patient ID: Brandon Colon, male    DOB: July 23, 1955  Age: 61 y.o. MRN: [id number]  CC: Knee Pain (right knee pain at times,lock up at times, consult of what to do to get better. med refills? flu shot?)   HPI   Chronic right knee pain: Secondary to OA. Pain is worse with prologed walking and standing. No improvement with intra articular joint injection x 2 Improvement with pennsaid and duxexis. Will like samples if availble. DG knee indicates mild OA. States he is unable to afford ortho visits at this time. Denies any injury.  HTN: Improved with amlodipine and  Lisinopril. BP Readings from Last 3 Encounters:  05/14/17 (!) 132/96  02/11/17 (!) 172/110  12/10/16 (!) 162/108    Outpatient Medications Prior to Visit  Medication Sig Dispense Refill  . DUEXIS 800-26.6 MG TABS Take 1 tablet by mouth 3 (three) times daily as needed. 90 tablet 0  . PENNSAID 2 % SOLN Place 1 application onto the skin 2 (two) times daily as needed. 112 g 0  . amLODipine (NORVASC) 10 MG tablet Take 1 tablet (10 mg total) by mouth daily. 90 tablet 3  . cloNIDine (CATAPRES) 0.2 MG tablet TAKE 1 TABLET(0.2 MG) BY MOUTH AT BEDTIME 90 tablet 0  . lisinopril (PRINIVIL,ZESTRIL) 40 MG tablet TAKE 1 TABLET(40 MG) BY MOUTH DAILY 90 tablet 0   Facility-Administered Medications Prior to Visit  Medication Dose Route Frequency Provider Last Rate Last Dose  . 0.9 %  sodium chloride infusion  500 mL Intravenous Continuous Brandon Artist, MD        ROS See HPI  Objective:  BP (!) 132/96   Pulse 77   Temp 97.8 F (36.6 C)   Ht 5\' 9"  (1.753 m)   Wt 212 lb (96.2 kg)   SpO2 98%   BMI 31.31 kg/m   BP Readings from Last 3 Encounters:  05/14/17 (!) 132/96  02/11/17 (!) 172/110  12/10/16 (!) 162/108    Wt Readings from Last 3 Encounters:  05/14/17 212 lb (96.2 kg)  02/11/17 198 lb 6.4 oz (90 kg)  12/10/16 198 lb (89.8 kg)    Physical Exam  Constitutional: He is oriented to person, place,  and time. No distress.  Cardiovascular: Normal rate, regular rhythm and normal heart sounds.  Pulmonary/Chest: Effort normal and breath sounds normal.  Musculoskeletal: He exhibits no edema or tenderness.       Right knee: Normal. He exhibits normal range of motion, no swelling, no effusion and no erythema. No tenderness found. No patellar tendon tenderness noted.  Crepitus in bilateral knees  Neurological: He is alert and oriented to person, place, and time.  Skin: Skin is warm and dry.  Psychiatric: He has a normal mood and affect. His behavior is normal. Thought content normal.  Vitals reviewed.   Lab Results  Component Value Date   WBC 12.2 (H) 01/09/2015   HGB 11.4 (L) 01/09/2015   HCT 32.5 (L) 01/09/2015   PLT 135 (L) 01/09/2015   GLUCOSE 91 05/14/2017   CHOL 201 (H) 05/14/2017   TRIG 125.0 05/14/2017   HDL 50.60 05/14/2017   LDLCALC 125 (H) 05/14/2017   ALT 39 05/14/2017   AST 26 05/14/2017   NA 139 05/14/2017   K 3.8 05/14/2017   CL 103 05/14/2017   CREATININE 0.75 05/14/2017   BUN 10 05/14/2017   CO2 29 05/14/2017   PSA 0.36 02/29/2008   INR 1.03 01/01/2015   HGBA1C 5.0 05/14/2017  Dg Knee Complete 4 Views Right  Result Date: 11/02/2016 CLINICAL DATA:  Right knee pain, no known injury EXAM: RIGHT KNEE - COMPLETE 4+ VIEW COMPARISON:  None. FINDINGS: Mild medial joint space narrowing is noted. No acute fracture or dislocation is noted. No soft tissue abnormality is seen. IMPRESSION: Mild degenerative change without acute abnormality. Electronically Signed   By: Brandon Colon M.D.   On: 11/02/2016 10:50    Assessment & Plan:   Brandon Colon was seen today for knee pain.  Diagnoses and all orders for this visit:  Essential hypertension -     Basic metabolic panel; Future -     Hepatic function panel; Future -     amLODipine (NORVASC) 10 MG tablet; Take 1 tablet (10 mg total) daily by mouth. -     cloNIDine (CATAPRES) 0.2 MG tablet; TAKE 1 TABLET(0.2 MG) BY MOUTH AT  BEDTIME -     lisinopril (PRINIVIL,ZESTRIL) 40 MG tablet; TAKE 1 TABLET(40 MG) BY MOUTH DAILY  Primary osteoarthritis of right knee  Encounter for lipid screening for cardiovascular disease -     Hepatic function panel; Future -     Lipid panel; Future  Hyperglycemia -     Hepatic function panel; Future -     Hemoglobin A1c; Future   I have changed Brandon Colon's amLODipine. I am also having him maintain his DUEXIS, PENNSAID, cloNIDine, and lisinopril. We will continue to administer sodium chloride.  Meds ordered this encounter  Medications  . amLODipine (NORVASC) 10 MG tablet    Sig: Take 1 tablet (10 mg total) daily by mouth.    Dispense:  90 tablet    Refill:  1    Order Specific Question:   Supervising Provider    Answer:   Brandon Colon [[id number]]  . cloNIDine (CATAPRES) 0.2 MG tablet    Sig: TAKE 1 TABLET(0.2 MG) BY MOUTH AT BEDTIME    Dispense:  90 tablet    Refill:  1    Order Specific Question:   Supervising Provider    Answer:   Brandon Colon [[id number]]  . lisinopril (PRINIVIL,ZESTRIL) 40 MG tablet    Sig: TAKE 1 TABLET(40 MG) BY MOUTH DAILY    Dispense:  90 tablet    Refill:  1    Order Specific Question:   Supervising Provider    Answer:   Brandon Colon [[id number]]    Follow-up: Return in about 3 months (around 08/14/2017) for with Brandon Beach, NP.  Brandon Lacy, NP

## 2017-05-17 ENCOUNTER — Encounter: Payer: Self-pay | Admitting: Nurse Practitioner

## 2017-06-07 ENCOUNTER — Other Ambulatory Visit: Payer: Self-pay | Admitting: Nurse Practitioner

## 2017-06-07 DIAGNOSIS — M25561 Pain in right knee: Secondary | ICD-10-CM

## 2017-06-08 ENCOUNTER — Other Ambulatory Visit: Payer: Self-pay

## 2017-06-08 DIAGNOSIS — M25561 Pain in right knee: Secondary | ICD-10-CM

## 2017-06-08 MED ORDER — IBUPROFEN-FAMOTIDINE 800-26.6 MG PO TABS: 1.0000 | ORAL_TABLET | Freq: Three times a day (TID) | ORAL | 0 refills | Status: DC | PRN

## 2017-08-06 ENCOUNTER — Other Ambulatory Visit: Payer: Self-pay | Admitting: Nurse Practitioner

## 2017-08-06 DIAGNOSIS — M25561 Pain in right knee: Secondary | ICD-10-CM

## 2017-08-09 ENCOUNTER — Telehealth: Payer: Self-pay | Admitting: Nurse Practitioner

## 2017-08-09 NOTE — Telephone Encounter (Signed)
Call patient, No VM picked up. Patient needs to reschedule this transfer care appointment be fore we can refill any medication.

## 2017-08-09 NOTE — Telephone Encounter (Signed)
Copied from [place]. Topic: Quick Communication - Rx Refill/Question >> Aug 09, 2017  9:43 AM Oliver Pila B wrote: Medication:  Ibuprofen-Famotidine (DUEXIS) 800-26.6 MG TABS [[id number]]    Has the patient contacted their pharmacy? Yes.     Preferred Pharmacy (with phone number or street name): [facility]  Pharmacist wanted Pcp to know that the Rx is going to be given to pt in a couple of days they are mailing the Rx

## 2017-08-17 ENCOUNTER — Other Ambulatory Visit: Payer: Self-pay | Admitting: Nurse Practitioner

## 2017-08-17 DIAGNOSIS — M25561 Pain in right knee: Secondary | ICD-10-CM

## 2017-08-18 ENCOUNTER — Other Ambulatory Visit: Payer: Self-pay | Admitting: *Deleted

## 2017-08-18 DIAGNOSIS — M25561 Pain in right knee: Secondary | ICD-10-CM

## 2017-08-20 ENCOUNTER — Ambulatory Visit: Payer: BC Managed Care – PPO | Admitting: Nurse Practitioner

## 2017-08-20 ENCOUNTER — Ambulatory Visit: Payer: BC Managed Care – PPO | Admitting: Family Medicine

## 2017-08-20 ENCOUNTER — Encounter: Payer: Self-pay | Admitting: Family Medicine

## 2017-08-20 DIAGNOSIS — M25561 Pain in right knee: Secondary | ICD-10-CM | POA: Diagnosis not present

## 2017-08-20 DIAGNOSIS — I10 Essential (primary) hypertension: Secondary | ICD-10-CM | POA: Diagnosis not present

## 2017-08-20 MED ORDER — IBUPROFEN-FAMOTIDINE 800-26.6 MG PO TABS: 1.0000 | ORAL_TABLET | Freq: Three times a day (TID) | ORAL | 0 refills | Status: DC | PRN

## 2017-08-20 MED ORDER — DICLOFENAC SODIUM 2 % TD SOLN: 1.0000 "application " | Freq: Two times a day (BID) | TRANSDERMAL | 3 refills | Status: DC

## 2017-08-20 MED ORDER — AMLODIPINE BESYLATE 10 MG PO TABS: 10.0000 mg | ORAL_TABLET | Freq: Every day | ORAL | 1 refills | Status: DC

## 2017-08-20 MED ORDER — LISINOPRIL 40 MG PO TABS: ORAL_TABLET | ORAL | 1 refills | Status: AC

## 2017-08-20 NOTE — Progress Notes (Signed)
Brandon Colon - 62 y.o. male MRN [id number]  Date of birth: 1956-02-19  SUBJECTIVE:  Including CC & ROS.  Chief Complaint  Patient presents with  . Right knee pain    Brandon Colon is a 62 y.o. male that is presenting with right knee pain.  Pain has is chronic in nature, has been increasing over the past month. Pain is located in the medial aspect of his right knee. Pain is constant stabbing pain. Difficult to walk. Denies injury or surgeries. He received cortisone injections in the past, which did not help. He has been taking Duexis and applying Pennsaid with some improvement. Denies tingling and numbness. 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   Independent review of his right knee x-ray from 11/02/16 shows mild medial joint space narrowing.   Hypertension. Has been fairly controlled has not followed up. Has been out of his medications.  Review of Systems  Constitutional: Negative for fever.  Cardiovascular: Negative for chest pain.  Gastrointestinal: Negative for abdominal pain.  Musculoskeletal: Positive for arthralgias and gait problem.  Neurological: Negative for weakness.  Hematological: Negative for adenopathy.  Psychiatric/Behavioral: Negative for agitation.    HISTORY: Past Medical, Surgical, Social, and Family History Reviewed & Updated per EMR.   Pertinent Historical Findings include:  Past Medical History:  Diagnosis Date  . Allergy    occ  . Arthritis   . Avascular necrosis (HCC)    LEFT KNEE  . Difficulty sleeping    DUE TO KNEE PAIN  . HTN (hypertension)   . TIA (transient ischemic attack) 2005    Past Surgical History:  Procedure Laterality Date  . FINGER SURGERY  1989   RT INDEX FINGER  . TOTAL KNEE ARTHROPLASTY Left 01/08/2015   Procedure: TOTAL LEFT  KNEE ARTHROPLASTY;  Surgeon: Paralee Cancel, MD;  Location: [facility];  Service: Orthopedics;  Laterality: Left;    No Known  Allergies  Family History  Problem Relation Age of Onset  . Heart disease Mother   . Hypertension Mother   . Heart disease Father   . Hypertension Father   . Hypertension Maternal Grandmother   . Hypertension Maternal Grandfather   . Hypertension Paternal Grandmother   . Hypertension Paternal Grandfather   . Colon cancer Neg Hx   . Colon polyps Neg Hx   . Esophageal cancer Neg Hx   . Rectal cancer Neg Hx   . Stomach cancer Neg Hx      Social History   Socioeconomic History  . Marital status: Single    Spouse name: Not on file  . Number of children: 0  . Years of education: 59  . Highest education level: Not on file  Social Needs  . Financial resource strain: Not on file  . Food insecurity - worry: Not on file  . Food insecurity - inability: Not on file  . Transportation needs - medical: Not on file  . Transportation needs - non-medical: Not on file  Occupational History  . Occupation: Custodian  Tobacco Use  . Smoking status: Never Smoker  . Smokeless tobacco: Never Used  Substance and Sexual Activity  . Alcohol use: Yes    Comment: occasionally  . Drug use: No  . Sexual activity: Not on file  Other Topics Concern  . Not on file  Social History Narrative   Fun: Running, movies, watch TV   Denies religious beliefs effecting health care.  PHYSICAL EXAM:  VS: BP (!) 146/78 (BP Location: Left Arm, Patient Position: Sitting, Cuff Size: Normal)   Pulse 76   Temp 98.7 F (37.1 C) (Oral)   Ht 5\' 9"  (1.753 m)   Wt 207 lb (93.9 kg)   SpO2 98%   BMI 30.57 kg/m  Physical Exam Gen: NAD, alert, cooperative with exam,  ENT: normal lips, normal nasal mucosa,  Eye: normal EOM, normal conjunctiva and lids CV:  no edema, +2 pedal pulses   Resp: no accessory muscle use, non-labored,  Skin: no rashes, no areas of induration  Neuro: normal tone, normal sensation to touch Psych:  normal insight, alert and oriented MSK:  Right knee: No obvious  effusion. Tenderness to palpation of the medial joint line. Normal flexion and extension. No significant instability with valgus and varus stress testing. No tenderness over the lateral joint line. No pain with patellar compression. Negative McMurray's test. Neurovascular intact      ASSESSMENT & PLAN:   Essential hypertension Mildly uncontrolled. Has been out of medications recently - Refilled amlodipine and lisinopril  Right knee pain Likely has a component of mild osteoarthritis but also some patellofemoral pain. 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 - Refilled Duexis and pennsaid -counseled on home exercise therapy - If no improvement could consider injection versus physical therapy

## 2017-08-20 NOTE — Assessment & Plan Note (Signed)
Likely has a component of mild osteoarthritis but also some patellofemoral pain. 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 - Refilled Duexis and pennsaid -counseled on home exercise therapy - If no improvement could consider injection versus physical therapy

## 2017-08-20 NOTE — Assessment & Plan Note (Signed)
Mildly uncontrolled. Has been out of medications recently - Refilled amlodipine and lisinopril

## 2017-08-20 NOTE — Patient Instructions (Signed)
Please try the medication and icing. Please try the exercises I have provided for you. Please follow-up if her symptoms do not improve and we can always do an injection future.

## 2017-09-16 ENCOUNTER — Ambulatory Visit: Payer: BC Managed Care – PPO | Admitting: Nurse Practitioner

## 2017-09-16 DIAGNOSIS — Z0289 Encounter for other administrative examinations: Secondary | ICD-10-CM

## 2019-04-03 ENCOUNTER — Ambulatory Visit (INDEPENDENT_AMBULATORY_CARE_PROVIDER_SITE_OTHER): Payer: Self-pay

## 2019-04-03 ENCOUNTER — Encounter (HOSPITAL_COMMUNITY): Payer: Self-pay | Admitting: Family Medicine

## 2019-04-03 ENCOUNTER — Other Ambulatory Visit: Payer: Self-pay

## 2019-04-03 ENCOUNTER — Ambulatory Visit (HOSPITAL_COMMUNITY)
Admission: EM | Admit: 2019-04-03 | Discharge: 2019-04-03 | Disposition: A | Discharge status: Home / Self Care | Payer: Self-pay | Attending: Family Medicine | Admitting: Family Medicine

## 2019-04-03 DIAGNOSIS — R509 Fever, unspecified: Secondary | ICD-10-CM | POA: Insufficient documentation

## 2019-04-03 DIAGNOSIS — R000 Tachycardia, unspecified: Secondary | ICD-10-CM | POA: Insufficient documentation

## 2019-04-03 DIAGNOSIS — R0602 Shortness of breath: Secondary | ICD-10-CM | POA: Insufficient documentation

## 2019-04-03 DIAGNOSIS — R059 Cough, unspecified: Secondary | ICD-10-CM

## 2019-04-03 DIAGNOSIS — Z20828 Contact with and (suspected) exposure to other viral communicable diseases: Secondary | ICD-10-CM | POA: Insufficient documentation

## 2019-04-03 DIAGNOSIS — I517 Cardiomegaly: Secondary | ICD-10-CM

## 2019-04-03 DIAGNOSIS — Z8673 Personal history of transient ischemic attack (TIA), and cerebral infarction without residual deficits: Secondary | ICD-10-CM | POA: Insufficient documentation

## 2019-04-03 DIAGNOSIS — R05 Cough: Secondary | ICD-10-CM | POA: Insufficient documentation

## 2019-04-03 DIAGNOSIS — M1990 Unspecified osteoarthritis, unspecified site: Secondary | ICD-10-CM | POA: Insufficient documentation

## 2019-04-03 DIAGNOSIS — R5383 Other fatigue: Secondary | ICD-10-CM | POA: Insufficient documentation

## 2019-04-03 DIAGNOSIS — I119 Hypertensive heart disease without heart failure: Secondary | ICD-10-CM | POA: Insufficient documentation

## 2019-04-03 MED ORDER — ACETAMINOPHEN 325 MG PO TABS
ORAL_TABLET | ORAL | Status: AC
  Filled 2019-04-03: qty 3

## 2019-04-03 MED ORDER — ACETAMINOPHEN 325 MG PO TABS
975.0000 mg | ORAL_TABLET | Freq: Once | ORAL | Status: AC
  Administered 2019-04-03: 12:00:00 975 mg via ORAL

## 2019-04-03 NOTE — ED Provider Notes (Addendum)
Brandon Colon   [id number] 04/03/19 Arrival Time: 1026  ASSESSMENT & PLAN:  1. Shortness of breath   2. Fever, unspecified fever cause   3. Cough   4. Cardiomegaly     Recommend that he proceed to the ED for evaluation given findings today including borderline hypoxia, cardiomegaly and early interstitial edema seen on CXR, and possibility of COVID-19 infection. He voices understanding.  Follow-up Information    Go to  [facility].   Specialty: Emergency Medicine Contact information: [address] [id number] [place] [phone]         Stable upon discharge.  ECG: Regularly irregular rhythm. Tachycardic. P waves visible. No signs of ischemia.  I have personally viewed the imaging studies ordered this visit. Bilateral mild haziness at bases. No obvious infiltrates appreciated.  Meds ordered this encounter  Medications  . acetaminophen (TYLENOL) tablet 975 mg   Reviewed expectations re: course of current medical issues. Questions answered. Outlined signs and symptoms indicating need for more acute intervention. Patient verbalized understanding. After Visit Summary given.   SUBJECTIVE: History from: patient. Brandon Colon is a 63 y.o. male who presents with complaint of feeling short of breath beginning a few days ago. Hasn't been feeling well over this past week; chills and fatigue. No associated chest pain. No abdominal pain. Feels he has been running a fever for the past 1-2 days; subjective with chills. Normal PO intake and appetite. Without n/v. Denies: claudication, irregular heart beat, lower extremity edema, near-syncope, orthopnea, palpitations, paroxysmal nocturnal dyspnea and syncope. Aggravating factors: have not been identified. Alleviating factors: have not been identified. Recent illnesses: none. No known COVID exposure. Ambulatory without assistance. No new medications.  Self/OTC treatment: none reported.  Social History   Tobacco Use  Smoking Status Never Smoker  Smokeless Tobacco Never Used   No illicit drug use.  ROS: As per HPI. All other systems negative.   OBJECTIVE:  Vitals:   04/03/19 1140  BP: (!) 137/94  Pulse: (!) 108  Resp: (!) 36  Temp: (!) 100.6 F (38.1 C)  TempSrc: Oral  SpO2: 93%    General appearance: alert, oriented, no acute distress but does appear fatigued Eyes: PERRLA; EOMI; conjunctivae normal HENT: normocephalic; atraumatic Neck: supple with FROM Lungs: tachypnea but able to speak full sentences; mild rales at bases of lungs (recheck RR 24) Heart: regularly irregular rhythm; slight tachycardia Chest Wall: without tenderness to palpation Abdomen: soft, non-tender; bowel sounds normal; no masses or organomegaly; no guarding or rebound tenderness Extremities: lower: without edema; without calf swelling or tenderness; symmetrical without gross deformities Skin: warm and dry; without rash or lesions Psychological: alert and cooperative; normal mood and affect  Labs:  Labs Reviewed  NOVEL CORONAVIRUS, NAA (HOSP ORDER, SEND-OUT TO REF LAB; TAT 18-24 HRS)    Imaging: Dg Chest 2 View  Result Date: 04/03/2019 CLINICAL DATA:  Fever, shortness of breath, wheezing EXAM: CHEST - 2 VIEW COMPARISON:  12/13/2013 FINDINGS: Cardiomegaly with vascular congestion. Interstitial opacities in the perihilar and lower lobe regions likely early pulmonary edema. No effusions or acute bony abnormality. IMPRESSION: Cardiomegaly with vascular congestion and probable early interstitial edema. Electronically Signed   By: Rolm Baptise M.D.   On: 04/03/2019 12:24     No Known Allergies  Past Medical History:  Diagnosis Date  . Allergy    occ  . Arthritis   . Avascular necrosis (HCC)    LEFT KNEE  . Difficulty sleeping  DUE TO KNEE PAIN  . HTN (hypertension)   . TIA (transient ischemic attack) 2005   Social History    Socioeconomic History  . Marital status: Single    Spouse name: Not on file  . Number of children: 0  . Years of education: 78  . Highest education level: Not on file  Occupational History  . Occupation: Custodian  Social Needs  . Financial resource strain: Not on file  . Food insecurity    Worry: Not on file    Inability: Not on file  . Transportation needs    Medical: Not on file    Non-medical: Not on file  Tobacco Use  . Smoking status: Never Smoker  . Smokeless tobacco: Never Used  Substance and Sexual Activity  . Alcohol use: Yes    Comment: occasionally  . Drug use: No  . Sexual activity: Not on file  Lifestyle  . Physical activity    Days per week: Not on file    Minutes per session: Not on file  . Stress: Not on file  Relationships  . Social Herbalist on phone: Not on file    Gets together: Not on file    Attends religious service: Not on file    Active member of club or organization: Not on file    Attends meetings of clubs or organizations: Not on file    Relationship status: Not on file  . Intimate partner violence    Fear of current or ex partner: Not on file    Emotionally abused: Not on file    Physically abused: Not on file    Forced sexual activity: Not on file  Other Topics Concern  . Not on file  Social History Narrative   Fun: Running, movies, watch TV   Denies religious beliefs effecting health care.    Family History  Problem Relation Age of Onset  . Heart disease Mother   . Hypertension Mother   . Heart disease Father   . Hypertension Father   . Hypertension Maternal Grandmother   . Hypertension Maternal Grandfather   . Hypertension Paternal Grandmother   . Hypertension Paternal Grandfather   . Colon cancer Neg Hx   . Colon polyps Neg Hx   . Esophageal cancer Neg Hx   . Rectal cancer Neg Hx   . Stomach cancer Neg Hx    Past Surgical History:  Procedure Laterality Date  . FINGER SURGERY  1989   RT INDEX FINGER  .  TOTAL KNEE ARTHROPLASTY Left 01/08/2015   Procedure: TOTAL LEFT  KNEE ARTHROPLASTY;  Surgeon: Paralee Cancel, MD;  Location: [facility];  Service: Orthopedics;  Laterality: Left;      Vanessa Kick, MD 04/03/19 1335    Vanessa Kick, MD 04/03/19 [phone]

## 2019-04-04 ENCOUNTER — Other Ambulatory Visit: Payer: Self-pay

## 2019-04-04 ENCOUNTER — Inpatient Hospital Stay (HOSPITAL_COMMUNITY)
Admission: EM | Admit: 2019-04-04 | Discharge: 2019-04-09 | DRG: 177 | Disposition: A | Discharge status: Home / Self Care | Payer: Medicaid Other | Attending: Internal Medicine | Admitting: Internal Medicine

## 2019-04-04 ENCOUNTER — Emergency Department (HOSPITAL_COMMUNITY): Payer: Medicaid Other

## 2019-04-04 ENCOUNTER — Encounter (HOSPITAL_COMMUNITY): Payer: Self-pay | Admitting: Emergency Medicine

## 2019-04-04 DIAGNOSIS — J189 Pneumonia, unspecified organism: Secondary | ICD-10-CM

## 2019-04-04 DIAGNOSIS — E871 Hypo-osmolality and hyponatremia: Secondary | ICD-10-CM | POA: Diagnosis present

## 2019-04-04 DIAGNOSIS — I493 Ventricular premature depolarization: Secondary | ICD-10-CM | POA: Diagnosis present

## 2019-04-04 DIAGNOSIS — U071 COVID-19: Principal | ICD-10-CM | POA: Diagnosis present

## 2019-04-04 DIAGNOSIS — Z1211 Encounter for screening for malignant neoplasm of colon: Secondary | ICD-10-CM

## 2019-04-04 DIAGNOSIS — Z791 Long term (current) use of non-steroidal anti-inflammatories (NSAID): Secondary | ICD-10-CM

## 2019-04-04 DIAGNOSIS — R740 Nonspecific elevation of levels of transaminase and lactic acid dehydrogenase [LDH]: Secondary | ICD-10-CM | POA: Diagnosis not present

## 2019-04-04 DIAGNOSIS — J1289 Other viral pneumonia: Secondary | ICD-10-CM | POA: Diagnosis present

## 2019-04-04 DIAGNOSIS — Z96652 Presence of left artificial knee joint: Secondary | ICD-10-CM | POA: Diagnosis present

## 2019-04-04 DIAGNOSIS — D6959 Other secondary thrombocytopenia: Secondary | ICD-10-CM | POA: Diagnosis present

## 2019-04-04 DIAGNOSIS — R7989 Other specified abnormal findings of blood chemistry: Secondary | ICD-10-CM

## 2019-04-04 DIAGNOSIS — Z79899 Other long term (current) drug therapy: Secondary | ICD-10-CM

## 2019-04-04 DIAGNOSIS — Z8673 Personal history of transient ischemic attack (TIA), and cerebral infarction without residual deficits: Secondary | ICD-10-CM | POA: Diagnosis not present

## 2019-04-04 DIAGNOSIS — N289 Disorder of kidney and ureter, unspecified: Secondary | ICD-10-CM

## 2019-04-04 DIAGNOSIS — Z8249 Family history of ischemic heart disease and other diseases of the circulatory system: Secondary | ICD-10-CM | POA: Diagnosis not present

## 2019-04-04 DIAGNOSIS — J9601 Acute respiratory failure with hypoxia: Secondary | ICD-10-CM | POA: Diagnosis present

## 2019-04-04 DIAGNOSIS — E876 Hypokalemia: Secondary | ICD-10-CM | POA: Diagnosis present

## 2019-04-04 DIAGNOSIS — I10 Essential (primary) hypertension: Secondary | ICD-10-CM | POA: Diagnosis present

## 2019-04-04 DIAGNOSIS — R778 Other specified abnormalities of plasma proteins: Secondary | ICD-10-CM

## 2019-04-04 DIAGNOSIS — Z6830 Body mass index (BMI) 30.0-30.9, adult: Secondary | ICD-10-CM

## 2019-04-04 DIAGNOSIS — R7401 Elevation of levels of liver transaminase levels: Secondary | ICD-10-CM | POA: Diagnosis present

## 2019-04-04 DIAGNOSIS — N179 Acute kidney failure, unspecified: Secondary | ICD-10-CM | POA: Diagnosis present

## 2019-04-04 DIAGNOSIS — E669 Obesity, unspecified: Secondary | ICD-10-CM | POA: Diagnosis present

## 2019-04-04 LAB — COMPREHENSIVE METABOLIC PANEL
ALT: 101 U/L — ABNORMAL HIGH (ref 0–44)
AST: 222 U/L — ABNORMAL HIGH (ref 15–41)
Albumin: 3.2 g/dL — ABNORMAL LOW (ref 3.5–5.0)
Alkaline Phosphatase: 60 U/L (ref 38–126)
Anion gap: 13 (ref 5–15)
BUN: 23 mg/dL (ref 8–23)
CO2: 24 mmol/L (ref 22–32)
Calcium: 7.8 mg/dL — ABNORMAL LOW (ref 8.9–10.3)
Chloride: 97 mmol/L — ABNORMAL LOW (ref 98–111)
Creatinine, Ser: 1.31 mg/dL — ABNORMAL HIGH (ref 0.61–1.24)
GFR calc Af Amer: >60 mL/min (ref >60)
GFR calc non Af Amer: 58 mL/min — ABNORMAL LOW (ref >60)
Glucose, Bld: 88 mg/dL (ref 70–99)
Potassium: 3 mmol/L — ABNORMAL LOW (ref 3.5–5.1)
Sodium: 134 mmol/L — ABNORMAL LOW (ref 135–145)
Total Bilirubin: 1.8 mg/dL — ABNORMAL HIGH (ref 0.3–1.2)
Total Protein: 6.7 g/dL (ref 6.5–8.1)

## 2019-04-04 LAB — BASIC METABOLIC PANEL
Anion gap: 14 (ref 5–15)
BUN: 25 mg/dL — ABNORMAL HIGH (ref 8–23)
CO2: 23 mmol/L (ref 22–32)
Calcium: 8.2 mg/dL — ABNORMAL LOW (ref 8.9–10.3)
Chloride: 95 mmol/L — ABNORMAL LOW (ref 98–111)
Creatinine, Ser: 1.5 mg/dL — ABNORMAL HIGH (ref 0.61–1.24)
GFR calc Af Amer: 57 mL/min — ABNORMAL LOW (ref >60)
GFR calc non Af Amer: 49 mL/min — ABNORMAL LOW (ref >60)
Glucose, Bld: 101 mg/dL — ABNORMAL HIGH (ref 70–99)
Potassium: 2.7 mmol/L — CL (ref 3.5–5.1)
Sodium: 132 mmol/L — ABNORMAL LOW (ref 135–145)

## 2019-04-04 LAB — C-REACTIVE PROTEIN: CRP: 1.8 mg/dL — ABNORMAL HIGH (ref <1.0)

## 2019-04-04 LAB — CBC
HCT: 43.9 % (ref 39.0–52.0)
Hemoglobin: 15.2 g/dL (ref 13.0–17.0)
MCH: 28.6 pg (ref 26.0–34.0)
MCHC: 34.6 g/dL (ref 30.0–36.0)
MCV: 82.5 fL (ref 80.0–100.0)
Platelets: 98 10*3/uL — ABNORMAL LOW (ref 150–400)
RBC: 5.32 MIL/uL (ref 4.22–5.81)
RDW: 12 % (ref 11.5–15.5)
WBC: 3.9 10*3/uL — ABNORMAL LOW (ref 4.0–10.5)
nRBC: 0 % (ref 0.0–0.2)

## 2019-04-04 LAB — PROCALCITONIN: Procalcitonin: 0.15 ng/mL

## 2019-04-04 LAB — TRIGLYCERIDES: Triglycerides: 166 mg/dL — ABNORMAL HIGH (ref <150)

## 2019-04-04 LAB — SARS CORONAVIRUS 2 BY RT PCR (HOSPITAL ORDER, PERFORMED IN ~~LOC~~ HOSPITAL LAB): SARS Coronavirus 2: POSITIVE — AB

## 2019-04-04 LAB — TROPONIN I (HIGH SENSITIVITY)
Troponin I (High Sensitivity): 139 ng/L (ref <18)
Troponin I (High Sensitivity): 125 ng/L (ref <18)

## 2019-04-04 LAB — FERRITIN: Ferritin: 597 ng/mL — ABNORMAL HIGH (ref 24–336)

## 2019-04-04 LAB — LACTATE DEHYDROGENASE: LDH: 683 U/L — ABNORMAL HIGH (ref 98–192)

## 2019-04-04 LAB — D-DIMER, QUANTITATIVE: D-Dimer, Quant: 1.96 ug/mL-FEU — ABNORMAL HIGH (ref 0.00–0.50)

## 2019-04-04 LAB — FIBRINOGEN: Fibrinogen: 561 mg/dL — ABNORMAL HIGH (ref 210–475)

## 2019-04-04 LAB — BRAIN NATRIURETIC PEPTIDE: B Natriuretic Peptide: 20 pg/mL (ref 0.0–100.0)

## 2019-04-04 LAB — LACTIC ACID, PLASMA: Lactic Acid, Venous: 2.3 mmol/L (ref 0.5–1.9)

## 2019-04-04 LAB — MAGNESIUM: Magnesium: 2.1 mg/dL (ref 1.7–2.4)

## 2019-04-04 MED ORDER — POTASSIUM CHLORIDE 10 MEQ/100ML IV SOLN
10.0000 meq | Freq: Once | INTRAVENOUS | Status: AC
  Administered 2019-04-04: 10 meq via INTRAVENOUS
  Filled 2019-04-04: qty 100

## 2019-04-04 MED ORDER — SODIUM CHLORIDE 0.9 % IV SOLN
1.0000 g | Freq: Once | INTRAVENOUS | Status: AC
  Administered 2019-04-04: 14:00:00 1 g via INTRAVENOUS
  Filled 2019-04-04: qty 10

## 2019-04-04 MED ORDER — SODIUM CHLORIDE 0.9% FLUSH
3.0000 mL | Freq: Once | INTRAVENOUS | Status: AC
  Administered 2019-04-04: 12:00:00 3 mL via INTRAVENOUS

## 2019-04-04 MED ORDER — POTASSIUM CHLORIDE 10 MEQ/100ML IV SOLN: 10.0000 meq | INTRAVENOUS | Status: AC

## 2019-04-04 MED ORDER — SODIUM CHLORIDE 0.9 % IV SOLN
500.0000 mg | Freq: Once | INTRAVENOUS | Status: DC
  Filled 2019-04-04: qty 500

## 2019-04-04 MED ORDER — SODIUM CHLORIDE 0.9 % IV SOLN
500.0000 mL | INTRAVENOUS | Status: DC
  Administered 2019-04-05: 500 mL via INTRAVENOUS

## 2019-04-04 MED ORDER — SODIUM CHLORIDE 0.9 % IV BOLUS: 500.0000 mL | Freq: Once | INTRAVENOUS | Status: DC

## 2019-04-04 MED ORDER — LISINOPRIL 20 MG PO TABS
40.0000 mg | ORAL_TABLET | Freq: Every day | ORAL | Status: DC
  Administered 2019-04-04 – 2019-04-09 (×6): 40 mg via ORAL
  Filled 2019-04-04 (×6): qty 2

## 2019-04-04 MED ORDER — POTASSIUM CHLORIDE CRYS ER 20 MEQ PO TBCR
40.0000 meq | EXTENDED_RELEASE_TABLET | Freq: Once | ORAL | Status: AC
  Administered 2019-04-04: 40 meq via ORAL
  Filled 2019-04-04: qty 2

## 2019-04-04 MED ORDER — POTASSIUM CHLORIDE IN NACL 20-0.9 MEQ/L-% IV SOLN
INTRAVENOUS | Status: DC
  Administered 2019-04-05 (×2): via INTRAVENOUS
  Filled 2019-04-04 (×3): qty 1000

## 2019-04-04 NOTE — ED Notes (Signed)
[phone] 

## 2019-04-04 NOTE — ED Provider Notes (Signed)
[facility] EMERGENCY DEPARTMENT Provider Note   CSN: [id number] Arrival date & time: 04/04/19  [id number]     History   Chief Complaint Chief Complaint  Patient presents with  . Shortness of Breath    HPI Brandon Colon is a 63 y.o. male with history of TIA, hypertension, AVN presents today for shortness of breath and fever/chills that began on Friday, 03/31/2019.  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  He has gradually developed worsening shortness of breath since that time as well as a mild intermittent nonproductive cough.  Patient describes shortness of breath as feeling the inability to take a full breath.  He has not measured a temperature at home but has felt warm in addition to chills.  Patient denies headache/vision changes, neck pain, chest pain, hemoptysis, abdominal pain, nausea/vomiting, diarrhea, extremity pain/swelling, color change, fall/injury or any additional concerns.     HPI  Past Medical History:  Diagnosis Date  . Allergy    occ  . Arthritis   . Avascular necrosis (HCC)    LEFT KNEE  . Difficulty sleeping    DUE TO KNEE PAIN  . HTN (hypertension)   . TIA (transient ischemic attack) 2005    Patient Active Problem List   Diagnosis Date Noted  . Right arm pain 02/11/2017  . Right knee pain 11/02/2016  . Mass of finger 04/30/2016  . Essential hypertension 03/31/2016  . 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 S/P knee replacement 01/08/2015  . Routine general medical examination at a health care facility 11/21/2014    Past Surgical History:  Procedure Laterality Date  . FINGER SURGERY  1989   RT INDEX FINGER  . TOTAL KNEE ARTHROPLASTY Left 01/08/2015   Procedure: TOTAL LEFT  KNEE ARTHROPLASTY;  Surgeon: Paralee Cancel, MD;  Location: [facility];  Service: Orthopedics;  Laterality: Left;        Home Medications    Prior to Admission medications   Medication Sig Start Date End Date  Taking? Authorizing Provider  amLODipine (NORVASC) 10 MG tablet Take 1 tablet (10 mg total) by mouth daily. 08/20/17   Rosemarie Ax, MD  cloNIDine (CATAPRES) 0.2 MG tablet TAKE 1 TABLET(0.2 MG) BY MOUTH AT BEDTIME 05/14/17   Nche, Charlene Brooke, NP  Diclofenac Sodium (PENNSAID) 2 % SOLN Place 1 application onto the skin 2 (two) times daily. 08/20/17   Rosemarie Ax, MD  Ibuprofen-Famotidine (DUEXIS) 800-26.6 MG TABS Take 1 tablet by mouth 3 (three) times daily as needed. 08/20/17   Rosemarie Ax, MD  lisinopril (PRINIVIL,ZESTRIL) 40 MG tablet TAKE 1 TABLET(40 MG) BY MOUTH DAILY 08/20/17   Rosemarie Ax, MD  PENNSAID 2 % SOLN Place 1 application onto the skin 2 (two) times daily as needed. 03/11/17   [name], [name]    Family History Family History  Problem Relation Age of Onset  . Heart disease Mother   . Hypertension Mother   . Heart disease Father   . Hypertension Father   . Hypertension Maternal Grandmother   . Hypertension Maternal Grandfather   . Hypertension Paternal Grandmother   . Hypertension Paternal Grandfather   . Colon cancer Neg Hx   . Colon polyps Neg Hx   . Esophageal cancer Neg Hx   . Rectal cancer Neg Hx   . Stomach cancer Neg Hx     Social History Social History   Tobacco Use  . Smoking status: Never  Smoker  . Smokeless tobacco: Never Used  Substance Use Topics  . Alcohol use: Yes    Comment: occasionally  . Drug use: No     Allergies   Patient has no known allergies.   Review of Systems Review of Systems Ten systems are reviewed and are negative for acute change except as noted in the HPI   Physical Exam Updated Vital Signs BP (!) 171/111   Pulse 91   Temp 98.8 F (37.1 C) (Oral)   Resp (!) 32   SpO2 96%   Physical Exam Constitutional:      General: He is not in acute distress.    Appearance: Normal appearance. He is well-developed. He is not ill-appearing or diaphoretic.  HENT:     Head: Normocephalic and atraumatic.      Right Ear: External ear normal.     Left Ear: External ear normal.     Nose: Nose normal.  Eyes:     General: Vision grossly intact. Gaze aligned appropriately.     Pupils: Pupils are equal, round, and reactive to light.  Neck:     Musculoskeletal: Normal range of motion.     Trachea: Trachea and phonation normal. No tracheal deviation.  Cardiovascular:     Rate and Rhythm: Normal rate and regular rhythm.  Pulmonary:     Effort: Pulmonary effort is normal. No respiratory distress.     Comments: Mild wheezing and rhonchi/coarse breath sounds bilaterally. Chest:     Chest wall: No deformity, tenderness or crepitus.  Abdominal:     General: There is no distension.     Palpations: Abdomen is soft.     Tenderness: There is no abdominal tenderness. There is no guarding or rebound.  Musculoskeletal: Normal range of motion.     Right lower leg: He exhibits no tenderness. No edema.     Left lower leg: He exhibits no tenderness. No edema.  Skin:    General: Skin is warm and dry.     Capillary Refill: Capillary refill takes less than 2 seconds.  Neurological:     Mental Status: He is alert.     GCS: GCS eye subscore is 4. GCS verbal subscore is 5. GCS motor subscore is 6.     Comments: Speech is clear and goal oriented, follows commands Major Cranial nerves without deficit, no facial droop Moves extremities without ataxia, coordination intact  Psychiatric:        Behavior: Behavior normal.      ED Treatments / Results  Labs (all labs ordered are listed, but only abnormal results are displayed) Labs Reviewed  SARS CORONAVIRUS 2 ([facility]) - Abnormal; Notable for the following components:      Result Value   SARS Coronavirus 2 POSITIVE (*)    All other components within normal limits  BASIC METABOLIC PANEL - Abnormal; Notable for the following components:   Sodium 132 (*)    Potassium 2.7 (*)    Chloride 95 (*)    Glucose, Bld 101  (*)    BUN 25 (*)    Creatinine, Ser 1.50 (*)    Calcium 8.2 (*)    GFR calc non Af Amer 49 (*)    GFR calc Af Amer 57 (*)    All other components within normal limits  CBC - Abnormal; Notable for the following components:   WBC 3.9 (*)    Platelets 98 (*)    All other components within normal  limits  D-DIMER, QUANTITATIVE (NOT AT [facility]) - Abnormal; Notable for the following components:   D-Dimer, Quant 1.96 (*)    All other components within normal limits  FIBRINOGEN - Abnormal; Notable for the following components:   Fibrinogen 561 (*)    All other components within normal limits  TROPONIN I (HIGH SENSITIVITY) - Abnormal; Notable for the following components:   Troponin I (High Sensitivity) 139 (*)    All other components within normal limits  TROPONIN I (HIGH SENSITIVITY) - Abnormal; Notable for the following components:   Troponin I (High Sensitivity) 125 (*)    All other components within normal limits  CULTURE, BLOOD (ROUTINE X 2)  CULTURE, BLOOD (ROUTINE X 2)  BRAIN NATRIURETIC PEPTIDE  MAGNESIUM  LACTIC ACID, PLASMA  LACTIC ACID, PLASMA  COMPREHENSIVE METABOLIC PANEL  PROCALCITONIN  LACTATE DEHYDROGENASE  FERRITIN  TRIGLYCERIDES  C-REACTIVE PROTEIN    EKG EKG Interpretation  Date/Time:  Tuesday April 04 2019 09:56:55 EDT Ventricular Rate:  99 PR Interval:  172 QRS Duration: 88 QT Interval:  386 QTC Calculation: 495 R Axis:   54 Text Interpretation:  Normal sinus rhythm Moderate voltage criteria for LVH, may be normal variant Abnormal ECG  Similar to prior.  No STEMI  Confirmed by Nanda Quinton [phone]) on 04/04/2019 11:35:03 AM   Radiology Dg Chest 2 View  Result Date: 04/04/2019 CLINICAL DATA:  Onset shortness of breath, weakness and diaphoresis this morning. EXAM: CHEST - 2 VIEW COMPARISON:  PA and lateral chest 12/13/2013 and 04/03/2019. FINDINGS: Hazy airspace disease is seen in the right middle lobe and lingula. Heart size is mildly enlarged. No  pneumothorax or pleural fluid. No acute or focal bony abnormality. IMPRESSION: Hazy lingular and right middle lobe airspace disease could be due to pulmonary edema but has an appearance more worrisome for pneumonia including atypical/viral infection. Electronically Signed   By: Inge Rise M.D.   On: 04/04/2019 10:39   Dg Chest 2 View  Result Date: 04/03/2019 CLINICAL DATA:  Fever, shortness of breath, wheezing EXAM: CHEST - 2 VIEW COMPARISON:  12/13/2013 FINDINGS: Cardiomegaly with vascular congestion. Interstitial opacities in the perihilar and lower lobe regions likely early pulmonary edema. No effusions or acute bony abnormality. IMPRESSION: Cardiomegaly with vascular congestion and probable early interstitial edema. Electronically Signed   By: Rolm Baptise M.D.   On: 04/03/2019 12:24    Procedures Procedures (including critical care time)  Medications Ordered in ED Medications  azithromycin (ZITHROMAX) 500 mg in sodium chloride 0.9 % 250 mL IVPB (has no administration in time range)  potassium chloride 10 mEq in 100 mL IVPB (10 mEq Intravenous New Bag/Given 04/04/19 1523)  sodium chloride flush (NS) 0.9 % injection 3 mL (3 mLs Intravenous Given 04/04/19 1223)  potassium chloride 10 mEq in 100 mL IVPB (0 mEq Intravenous Stopped 04/04/19 1410)  cefTRIAXone (ROCEPHIN) 1 g in sodium chloride 0.9 % 100 mL IVPB (0 g Intravenous Stopped 04/04/19 1455)  potassium chloride SA (KLOR-CON) CR tablet 40 mEq (40 mEq Oral Given 04/04/19 1525)     Initial Impression / Assessment and Plan / ED Course  I have reviewed the triage vital signs and the nursing notes.  Pertinent labs & imaging results that were available during my care of the patient were reviewed by me and considered in my medical decision making (see chart for details).  Clinical Course as of Apr 03 1621  Tue Apr 04, 2019  1409 Dr. Audie Box   [BM]    Clinical Course  User Index [BM] Nuala Alpha A, PA-C   Initial high-sensitivity  troponin: 139 CBC with leukopenia and thrombocytopenia BMP with hypokalemia 2.7, creatinine 1.5, no baseline to compare BNP within normal limit Magnesium within normal limit CXR:  IMPRESSION:  Hazy lingular and right middle lobe airspace disease could be due to  pulmonary edema but has an appearance more worrisome for pneumonia  including atypical/viral infection.   EKG: Normal sinus rhythm Moderate voltage criteria for LVH, may be normal variant Abnormal ECG  Similar to prior.  No STEMI  Confirmed by Nanda Quinton [phone]) on 04/04/2019 11:35:03 AM - Discussed case with Dr. Laverta Baltimore, based on history, physical examination and work-up as above concern for COVID-19 viral infection at this time.  Patient will need admission.  COVID-19 test pending.  Discussed other etiologies of patient's shortness of breath including pulmonary embolism, feel this is unlikely at this time as patient is without chest pain, additionally no sign of DVT on physical examination clinically picture more supportive of viral type pneumonia.  Will begin patient on Antibiotics for now will allow hospital team to de-escalate antibiotics. - Delta troponin: 125 - Informed by nursing staff patient has had episode run of what looks to be tach, patient has been having recurrent PVCs and now has what looks to be 3 PVCs together.  Discussed with Dr. Laverta Baltimore will be seeing patient.  Patient has been placed on pads, consult contact cardiology. - Discussed case with Dr. Audie Box, has reviewed telemetry strips advisements. 3.  V. tach, nonsustained.  Recommends repletion of potassium and echocardiogram after admission.  Advises that when COVID 19 test returned positive patient may go to [facility], no need for admission here. - COVID-19 positive, patient reassessed tachypneic but no acute distress.  SPO2 96% on room air.  Discussed case with Dr. Jamse Arn will be seeing patient for admission, additional COVID labs have been ordered.  Patient  admitted to hospital service for further evaluation and management.  Note: Portions of this report may have been transcribed using voice recognition software. Every effort was made to ensure accuracy; however, inadvertent computerized transcription errors may still be present. Final Clinical Impressions(s) / ED Diagnoses   Final diagnoses:  [id number] virus detected  Community acquired pneumonia, unspecified laterality    ED Discharge Orders    None       Gari Crown 04/04/19 1622    Margette Fast, MD 04/05/19 [phone]

## 2019-04-04 NOTE — ED Triage Notes (Addendum)
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 Pt arrives to triage able to speak in complete sentences but respiration are 30 and pt very sweaty.

## 2019-04-04 NOTE — ED Notes (Signed)
Printout given to EDP which shows 3 beat run of vtach. This has now happened X4 and per ED PA, pt placed on Zoll. Pt reports breathing is better, however still appears labored. Pt denies CP at this time.

## 2019-04-04 NOTE — H&P (Signed)
History and Physical:    Brandon Colon   [id number] DOB: [birth year] DOA: 04/04/2019  Referring MD/provider: PA Athena Masse PCP: [name], [name]   Patient coming from: Home  Chief Complaint: Malaise, shortness of breath  History of Present Illness:   Brandon Colon is an 63 y.o. male with past medical history significant for hypertension was in his usual state of health until 4 days prior to admission when he started "feeling not quite right.  I know my body well and it was not right".  Patient felt like he had body aches.  He thought he might of had a fever but was not sure.  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  Patient also thought he might have felt a little short of breath but that sensation did not last very long.  At present patient states he has no real shortness of breath but feels weak and is rather tired and still having body aches.  Patient denies cough, nausea vomiting.  No diarrhea.  Patient denies any chest pain.  No orthopnea or PND.   ED Course:  The patient tested positive for COVID.  Chest x-ray was found to have increased interstitial markings consistent with either atypical pneumonia or mild pulmonary edema.  Patient denied shortness of breath and was noted to have O2 saturations of 96% on room air.  He was also noted to be hypokalemic with some renal insufficiency.   ROS:   ROS   Review of Systems: Endocrine: no heat/cold intolerance, no polyuria Cardiovascular: No palpitations, chest pain GI: No nausea, vomiting, diarrhea, constipation GU: No dysuria, increased frequency CNS: No numbness, dizziness, headache Musculoskeletal: No back pain, joint pain Blood/lymphatics: No easy bruising, bleeding Mood/affect: No anxiety/depression    Past Medical History:   Past Medical History:  Diagnosis Date  . Allergy    occ  . Arthritis   . Avascular necrosis (HCC)    LEFT  KNEE  . Difficulty sleeping    DUE TO KNEE PAIN  . HTN (hypertension)   . TIA (transient ischemic attack) 2005    Past Surgical History:   Past Surgical History:  Procedure Laterality Date  . FINGER SURGERY  1989   RT INDEX FINGER  . TOTAL KNEE ARTHROPLASTY Left 01/08/2015   Procedure: TOTAL LEFT  KNEE ARTHROPLASTY;  Surgeon: Paralee Cancel, MD;  Location: [facility];  Service: Orthopedics;  Laterality: Left;    Social History:   Social History   Socioeconomic History  . Marital status: Single    Spouse name: Not on file  . Number of children: 0  . Years of education: 41  . Highest education level: Not on file  Occupational History  . Occupation: Custodian  Social Needs  . Financial resource strain: Not on file  . Food insecurity    Worry: Not on file    Inability: Not on file  . Transportation needs    Medical: Not on file    Non-medical: Not on file  Tobacco Use  . Smoking status: Never Smoker  . Smokeless tobacco: Never Used  Substance and Sexual Activity  . Alcohol use: Yes    Comment: occasionally  . Drug use: No  . Sexual activity: Not on file  Lifestyle  . Physical activity    Days per week: Not on file    Minutes per session: Not on file  . Stress:  Not on file  Relationships  . Social Herbalist on phone: Not on file    Gets together: Not on file    Attends religious service: Not on file    Active member of club or organization: Not on file    Attends meetings of clubs or organizations: Not on file    Relationship status: Not on file  . Intimate partner violence    Fear of current or ex partner: Not on file    Emotionally abused: Not on file    Physically abused: Not on file    Forced sexual activity: Not on file  Other Topics Concern  . Not on file  Social History Narrative   Fun: Running, movies, watch TV   Denies religious beliefs effecting health care.     Allergies   Patient has no known allergies.  Family history:   Family  History  Problem Relation Age of Onset  . Heart disease Mother   . Hypertension Mother   . Heart disease Father   . Hypertension Father   . Hypertension Maternal Grandmother   . Hypertension Maternal Grandfather   . Hypertension Paternal Grandmother   . Hypertension Paternal Grandfather   . Colon cancer Neg Hx   . Colon polyps Neg Hx   . Esophageal cancer Neg Hx   . Rectal cancer Neg Hx   . Stomach cancer Neg Hx     Current Medications:   Prior to Admission medications   Medication Sig Start Date End Date Taking? Authorizing Provider  Diclofenac Sodium (PENNSAID) 2 % SOLN Place 1 application onto the skin 2 (two) times daily. 08/20/17   Rosemarie Ax, MD  Ibuprofen-Famotidine (DUEXIS) 800-26.6 MG TABS Take 1 tablet by mouth 3 (three) times daily as needed. 08/20/17   Rosemarie Ax, MD  lisinopril (PRINIVIL,ZESTRIL) 40 MG tablet TAKE 1 TABLET(40 MG) BY MOUTH DAILY 08/20/17   Rosemarie Ax, MD  PENNSAID 2 % SOLN Place 1 application onto the skin 2 (two) times daily as needed. 03/11/17   [name], [name]    Physical Exam:   Vitals:   04/04/19 1545 04/04/19 1645 04/04/19 1700 04/04/19 1715  BP: (!) 156/99 (!) 147/109 (!) 146/99 (!) 146/91  Pulse: 92 88 96 98  Resp:      Temp:      TempSrc:      SpO2: 94% (!) 83% 95% 94%     Physical Exam: Blood pressure (!) 146/91, pulse 98, temperature 98.8 F (37.1 C), temperature source Oral, resp. rate (!) 32, SpO2 94 %. Gen: Tired appearing man lying flat in bed in no acute distress. Eyes: Sclerae anicteric. Conjunctiva mildly injected. Chest: Coarse breath sounds bilaterally with rare low pitched wheeze. CV: Distant, regular, no audible murmurs. Abdomen: NABS, soft, nondistended, nontender. No tenderness to light or deep palpation. No rebound, no guarding. Extremities: No edema.  Skin: Warm and dry. No rashes, lesions or wounds. Neuro: Alert and oriented times 3; grossly nonfocal. Psych: Patient is cooperative,  logical and coherent with appropriate mood and affect.  Data Review:    Labs: Basic Metabolic Panel: Recent Labs  Lab 04/04/19 1010 04/04/19 1200 04/04/19 1515  NA 132*  --  134*  K 2.7*  --  3.0*  CL 95*  --  97*  CO2 23  --  24  GLUCOSE 101*  --  88  BUN 25*  --  23  CREATININE 1.50*  --  1.31*  CALCIUM 8.2*  --  7.8*  MG  --  2.1  --    Liver Function Tests: Recent Labs  Lab 04/04/19 1515  AST 222*  ALT 101*  ALKPHOS 60  BILITOT 1.8*  PROT 6.7  ALBUMIN 3.2*   No results for input(s): LIPASE, AMYLASE in the last 168 hours. No results for input(s): AMMONIA in the last 168 hours. CBC: Recent Labs  Lab 04/04/19 1010  WBC 3.9*  HGB 15.2  HCT 43.9  MCV 82.5  PLT 98*   Cardiac Enzymes: No results for input(s): CKTOTAL, CKMB, CKMBINDEX, TROPONINI in the last 168 hours.  BNP (last 3 results) No results for input(s): PROBNP in the last 8760 hours. CBG: No results for input(s): GLUCAP in the last 168 hours.  Urinalysis No results found for: COLORURINE, APPEARANCEUR, LABSPEC, PHURINE, GLUCOSEU, HGBUR, BILIRUBINUR, KETONESUR, PROTEINUR, UROBILINOGEN, NITRITE, LEUKOCYTESUR    Radiographic Studies: Dg Chest 2 View  Result Date: 04/04/2019 CLINICAL DATA:  Onset shortness of breath, weakness and diaphoresis this morning. EXAM: CHEST - 2 VIEW COMPARISON:  PA and lateral chest 12/13/2013 and 04/03/2019. FINDINGS: Hazy airspace disease is seen in the right middle lobe and lingula. Heart size is mildly enlarged. No pneumothorax or pleural fluid. No acute or focal bony abnormality. IMPRESSION: Hazy lingular and right middle lobe airspace disease could be due to pulmonary edema but has an appearance more worrisome for pneumonia including atypical/viral infection. Electronically Signed   By: Inge Rise M.D.   On: 04/04/2019 10:39   Dg Chest 2 View  Result Date: 04/03/2019 CLINICAL DATA:  Fever, shortness of breath, wheezing EXAM: CHEST - 2 VIEW COMPARISON:   12/13/2013 FINDINGS: Cardiomegaly with vascular congestion. Interstitial opacities in the perihilar and lower lobe regions likely early pulmonary edema. No effusions or acute bony abnormality. IMPRESSION: Cardiomegaly with vascular congestion and probable early interstitial edema. Electronically Signed   By: Rolm Baptise M.D.   On: 04/03/2019 12:24    EKG: Independently reviewed.  Sinus rhythm at 100.  LVH by voltage and left atrial enlargement.  Repolarization abnormality V6.  No acute ST-T wave changes.  Normal axis.   Assessment/Plan:   Principal Problem:   COVID-19 virus infection Active Problems:   Essential hypertension   Hypokalemia   Elevated troponin   Acute renal insufficiency   Transaminitis   64 year old man presents with myalgias and fevers and transient shortness of breath found to be COVID positive with increased interstitial markings on chest x-ray but with normal O2 saturations on room air.  He does have some increase in his AST and ALT as well as mild increases in high-sensitivity troponin.  He is also noted to be somewhat hyponatremic with profound hypokalemia.  COVID Since patient has no oxygen requirement and no shortness of breath at present and although he does have mild increased interstitial markings on chest x-ray, I will not initiate steroids at this point. In addition, is not particularly elevated either at 0.15, but neither is it entirely negative. Patient received 1 dose of ceftriaxone and azithromycin in the ED, but I am not continuing these as I do not believe he has community-acquired pneumonia at present. The need for Remdesivir or Actemra can be decided at [facility] by the inpatient team. Could consider following procalcitonin if warranted.  HYPOKALEMIA Patient received KCl 20 mEq IV and 40 mill equivalents p.o. in ED Repeat potassium was 3.0 We will give patient another 2 rounds of KCl 10 mEq IV Place patient on NS with 20 of K at 125 cc  an hour  Repeat potassium at 1030 tonight  HYPONATREMIA We will treat with normal saline 125 cc an hour, repeat in the morning  ACUTE RENAL FAILURE Likely secondary to SARS-CoV-2 infection, however will also treat with hydration given hyponatremia and hypokalemia suggestive of possible intravascular fluid depletion.  ELEVATED TROPONIN No chest pain or EKG changes Elevated troponin most likely secondary to SARS-CoV-2 infection. Repeat EKG in the morning.  ABNORMAL LFTS Patient denies any recent alcohol use, notes his last drink was July 2019. ALT elevations in transaminases is most likely secondary to SARS-CoV-2 infection, will follow  HTN Continue lisinopril    Other information:   DVT prophylaxis: Lovenox ordered. Code Status: Full code. Family Communication: Patient stated no need to contact family. Disposition Plan: Home Consults called: None Admission status: Inpatient  The medical decision making on this patient was of high complexity and the patient is at high risk for clinical deterioration, therefore this is a level 3 visit.    Dewaine Oats Tublu Chatterjee [facility]  If 7PM-7AM, please contact night-coverage [email or web address] Password [email or web address] 04/04/2019, 7:30 PM

## 2019-04-04 NOTE — ED Notes (Signed)
[facility] called for transport, it will be sometime after 7

## 2019-04-04 NOTE — ED Notes (Signed)
Got patient undress on the monitor patient is resting with call bell in reach 

## 2019-04-04 NOTE — ED Notes (Signed)
Troponin    139 Potassium  2.7

## 2019-04-05 ENCOUNTER — Encounter (HOSPITAL_COMMUNITY): Payer: Self-pay

## 2019-04-05 DIAGNOSIS — J9601 Acute respiratory failure with hypoxia: Secondary | ICD-10-CM

## 2019-04-05 DIAGNOSIS — J1289 Other viral pneumonia: Secondary | ICD-10-CM

## 2019-04-05 LAB — CBC
HCT: 40.7 % (ref 39.0–52.0)
Hemoglobin: 13.9 g/dL (ref 13.0–17.0)
MCH: 28.4 pg (ref 26.0–34.0)
MCHC: 34.2 g/dL (ref 30.0–36.0)
MCV: 83.1 fL (ref 80.0–100.0)
Platelets: 103 10*3/uL — ABNORMAL LOW (ref 150–400)
RBC: 4.9 MIL/uL (ref 4.22–5.81)
RDW: 12.3 % (ref 11.5–15.5)
WBC: 3.2 10*3/uL — ABNORMAL LOW (ref 4.0–10.5)
nRBC: 0 % (ref 0.0–0.2)

## 2019-04-05 LAB — COMPREHENSIVE METABOLIC PANEL
ALT: 87 U/L — ABNORMAL HIGH (ref 0–44)
AST: 176 U/L — ABNORMAL HIGH (ref 15–41)
Albumin: 3 g/dL — ABNORMAL LOW (ref 3.5–5.0)
Alkaline Phosphatase: 50 U/L (ref 38–126)
Anion gap: 8 (ref 5–15)
BUN: 21 mg/dL (ref 8–23)
CO2: 24 mmol/L (ref 22–32)
Calcium: 7.6 mg/dL — ABNORMAL LOW (ref 8.9–10.3)
Chloride: 104 mmol/L (ref 98–111)
Creatinine, Ser: 1.06 mg/dL (ref 0.61–1.24)
GFR calc Af Amer: >60 mL/min (ref >60)
GFR calc non Af Amer: >60 mL/min (ref >60)
Glucose, Bld: 99 mg/dL (ref 70–99)
Potassium: 3.2 mmol/L — ABNORMAL LOW (ref 3.5–5.1)
Sodium: 136 mmol/L (ref 135–145)
Total Bilirubin: 1.5 mg/dL — ABNORMAL HIGH (ref 0.3–1.2)
Total Protein: 6.4 g/dL — ABNORMAL LOW (ref 6.5–8.1)

## 2019-04-05 LAB — HIV ANTIBODY (ROUTINE TESTING W REFLEX): HIV Screen 4th Generation wRfx: NONREACTIVE

## 2019-04-05 LAB — LACTIC ACID, PLASMA: Lactic Acid, Venous: 1 mmol/L (ref 0.5–1.9)

## 2019-04-05 MED ORDER — SODIUM CHLORIDE 0.9 % IV SOLN
200.0000 mg | Freq: Once | INTRAVENOUS | Status: AC
  Administered 2019-04-05: 200 mg via INTRAVENOUS
  Filled 2019-04-05: qty 40

## 2019-04-05 MED ORDER — SODIUM CHLORIDE 0.9 % IV SOLN
100.0000 mg | INTRAVENOUS | Status: AC
  Administered 2019-04-06 – 2019-04-09 (×4): 100 mg via INTRAVENOUS
  Filled 2019-04-05 (×4): qty 20

## 2019-04-05 MED ORDER — DEXAMETHASONE SODIUM PHOSPHATE 10 MG/ML IJ SOLN
6.0000 mg | INTRAMUSCULAR | Status: DC
  Administered 2019-04-05 – 2019-04-09 (×5): 6 mg via INTRAVENOUS
  Filled 2019-04-05 (×5): qty 1

## 2019-04-05 MED ORDER — ACETAMINOPHEN 650 MG RE SUPP: 650.0000 mg | Freq: Four times a day (QID) | RECTAL | Status: DC | PRN

## 2019-04-05 MED ORDER — FAMOTIDINE 20 MG PO TABS
20.0000 mg | ORAL_TABLET | Freq: Every day | ORAL | Status: DC
  Administered 2019-04-05 – 2019-04-09 (×5): 20 mg via ORAL
  Filled 2019-04-05 (×5): qty 1

## 2019-04-05 MED ORDER — ZINC SULFATE 220 (50 ZN) MG PO CAPS
220.0000 mg | ORAL_CAPSULE | Freq: Every day | ORAL | Status: DC
  Administered 2019-04-05 – 2019-04-09 (×5): 220 mg via ORAL
  Filled 2019-04-05 (×5): qty 1

## 2019-04-05 MED ORDER — ENOXAPARIN SODIUM 40 MG/0.4ML ~~LOC~~ SOLN
40.0000 mg | Freq: Every day | SUBCUTANEOUS | Status: DC
  Administered 2019-04-05 – 2019-04-08 (×5): 40 mg via SUBCUTANEOUS
  Filled 2019-04-05 (×5): qty 0.4

## 2019-04-05 MED ORDER — VITAMIN C 500 MG PO TABS
500.0000 mg | ORAL_TABLET | Freq: Every day | ORAL | Status: DC
  Administered 2019-04-05 – 2019-04-09 (×5): 500 mg via ORAL
  Filled 2019-04-05 (×5): qty 1

## 2019-04-05 MED ORDER — INFLUENZA VAC SPLIT QUAD 0.5 ML IM SUSY
0.5000 mL | PREFILLED_SYRINGE | INTRAMUSCULAR | Status: DC
  Filled 2019-04-05: qty 0.5

## 2019-04-05 MED ORDER — ACETAMINOPHEN 325 MG PO TABS
650.0000 mg | ORAL_TABLET | Freq: Four times a day (QID) | ORAL | Status: DC | PRN
  Administered 2019-04-05: 650 mg via ORAL
  Filled 2019-04-05: qty 2

## 2019-04-05 MED ORDER — POTASSIUM CHLORIDE CRYS ER 20 MEQ PO TBCR
40.0000 meq | EXTENDED_RELEASE_TABLET | Freq: Once | ORAL | Status: AC
  Administered 2019-04-05: 40 meq via ORAL
  Filled 2019-04-05: qty 2

## 2019-04-05 NOTE — Progress Notes (Signed)
Sister updated

## 2019-04-05 NOTE — Progress Notes (Signed)
Patients emergency contact number is no longer in service.  Spoke with patient, he stated he has been in contact with his family and can wait for tomorrow to get another update on his condition.

## 2019-04-05 NOTE — Progress Notes (Addendum)
PROGRESS NOTE  Brandon Colon [id number] DOB: 30-Sep-1955 DOA: 04/04/2019  PCP: [name], [name]  Brief History/Interval Summary: 63 y.o. male with past medical history significant for hypertension was in his usual state of health until 4 days prior to admission when he started developing body aches, fever and chills.  Also had shortness of breath.  He presented to the emergency department.  He was positive for COVID-19.  Chest x-ray showed increased interstitial markers suggesting atypical pneumonia.  He was initially saturating normal on room air.  He was hospitalized for further management.  Subsequently he started becoming hypoxic.   Reason for Visit: Pneumonia due to COVID-19  Consultants: None  Procedures:   Antibiotics: Anti-infectives (From admission, onward)   Start     Dose/Rate Route Frequency Ordered Stop   04/04/19 1345  cefTRIAXone (ROCEPHIN) 1 g in sodium chloride 0.9 % 100 mL IVPB     1 g 200 mL/hr over 30 Minutes Intravenous  Once 04/04/19 1338 04/04/19 1455   04/04/19 1345  azithromycin (ZITHROMAX) 500 mg in sodium chloride 0.9 % 250 mL IVPB  Status:  Discontinued     500 mg 250 mL/hr over 60 Minutes Intravenous  Once 04/04/19 1338 04/05/19 1138      Subjective/Interval History: Patient states that he does have some shortness of breath.  Occasional dry cough.  No nausea or vomiting.  Denies any diarrhea.  No chest pain.    Assessment/Plan:  Acute Hypoxic Resp. Failure due to Acute Covid 19 Viral Illness  COVID-19 Labs  Recent Labs    04/04/19 1515  DDIMER 1.96*  FERRITIN 597*  LDH 683*  CRP 1.8*    Lab Results  Component Value Date   SARSCOV2NAA POSITIVE (A) 04/04/2019     Fever: Has been afebrile Oxygen requirements: On 2 L nasal cannula saturating in the early 90s. Antibacterials: Was given ceftriaxone and azithromycin but not continued. Remdesivir: Will be initiated today. Steroids: Will be initiated today. Diuretics: Not on a  regular basis Actemra: Not indicated yet Vitamin C and Zinc: Will be initiated DVT Prophylaxis:  Lovenox 40 mg daily  He is requiring 2 L of oxygen by nasal cannula.  His respiratory status is stable for the most part.  He will be initiated on steroids and Remdesivir.  His CRP was noted to be 1.8 yesterday.  We will trend inflammatory markers.  D-dimer 1.96.  Continue prophylactic doses of Lovenox.  Patient was briefly explained the likely course of illness seen with COVID-19.  He was also told about possibly using convalescent plasma as well as Actemra if he were to get worse.  For now he will just be treated with dexamethasone and Remdesivir.  Prone positioning, incentive spirometry and mobilization as much as possible.  HIV nonreactive.  Hypokalemia Will be repleted  Transaminitis Elevated liver function test most likely due to COVID-19.  Slightly improved today compared to yesterday.  Leukopenia Most likely due to acute illness from COVID-19.  Continue to monitor.  Mild thrombocytopenia Continue to monitor.  Hyponatremia Corrected this morning.  Essential hypertension Monitor blood pressures closely.  Well controlled.  Continue lisinopril.  Anticipate some worsening with steroids.  Obesity Estimated body mass index is 30.51 kg/m as calculated from the following:   Height as of this encounter: 5\' 9"  (1.753 m).   Weight as of this encounter: 93.7 kg.   DVT Prophylaxis: Lovenox PUD Prophylaxis: Initiate Pepcid Code Status: Full code Family Communication: Discussed with the patient Disposition Plan: Management as  outlined above.  Mobilize as much as tolerated.  Hopefully home when ready for discharge.   Medications:  Scheduled: . dexamethasone (DECADRON) injection  6 mg Intravenous Q24H  . enoxaparin (LOVENOX) injection  40 mg Subcutaneous QHS  . lisinopril  40 mg Oral Daily   Continuous: . sodium chloride Stopped (04/05/19 0947)  . remdesivir 200 mg in NS 250 mL      Followed by  . [START ON 04/06/2019] remdesivir 100 mg in NS 250 mL     [id number] **OR** acetaminophen   Objective:  Vital Signs  Vitals:   04/05/19 0753 04/05/19 0917 04/05/19 1135 04/05/19 1136  BP: 117/79 120/81 122/79   Pulse: 89  83   Resp: (!) 24  20   Temp: 99.5 F (37.5 C)     TempSrc: Oral  Oral   SpO2: 97%  95% 97%  Weight:      Height:        Intake/Output Summary (Last 24 hours) at 04/05/2019 1139 Last data filed at 04/05/2019 1012 Gross per 24 hour  Intake 1799.86 ml  Output 300 ml  Net 1499.86 ml   Filed Weights   04/05/19 0000  Weight: 93.7 kg    General appearance: Awake alert.  In no distress Resp: Coarse breath sounds bilaterally.  Mildly tachypneic at rest.  Crackles at the bases bilaterally.  No wheezing or rhonchi. Cardio: S1-S2 is normal regular.  No S3-S4.  No rubs murmurs or bruit GI: Abdomen is soft.  Nontender nondistended.  Bowel sounds are present normal.  No masses organomegaly Extremities: No edema.  Full range of motion of lower extremities. Neurologic: Alert and oriented x3.  No focal neurological deficits.    Lab Results:  Data Reviewed: I have personally reviewed following labs and imaging studies  CBC: Recent Labs  Lab 04/04/19 1010 04/05/19 0545  WBC 3.9* 3.2*  HGB 15.2 13.9  HCT 43.9 40.7  MCV 82.5 83.1  PLT 98* 103*    Basic Metabolic Panel: Recent Labs  Lab 04/04/19 1010 04/04/19 1200 04/04/19 1515 04/05/19 0545  NA 132*  --  134* 136  K 2.7*  --  3.0* 3.2*  CL 95*  --  97* 104  CO2 23  --  24 24  GLUCOSE 101*  --  88 99  BUN 25*  --  23 21  CREATININE 1.50*  --  1.31* 1.06  CALCIUM 8.2*  --  7.8* 7.6*  MG  --  2.1  --   --     GFR: Estimated Creatinine Clearance: 80.6 mL/min (by C-G formula based on SCr of 1.06 mg/dL).  Liver Function Tests: Recent Labs  Lab 04/04/19 1515 04/05/19 0545  AST 222* 176*  ALT 101* 87*  ALKPHOS 60 50  BILITOT 1.8* 1.5*  PROT 6.7 6.4*  ALBUMIN 3.2* 3.0*      Lipid Profile: Recent Labs    04/04/19 1452  TRIG 166*     Anemia Panel: Recent Labs    04/04/19 1515  FERRITIN 597*    Recent Results (from the past 240 hour(s))  Blood culture (routine x 2)     Status: None (Preliminary result)   Collection Time: 04/04/19 12:17 PM   Specimen: BLOOD LEFT HAND  Result Value Ref Range Status   Specimen Description BLOOD LEFT HAND  Final   Special Requests   Final    BOTTLES DRAWN AEROBIC AND ANAEROBIC Blood Culture results may not be optimal due to an inadequate volume of blood received  in culture bottles   Culture   Final    NO GROWTH < 24 HOURS Performed at [facility], [place] [address], [place], [place] [address]    Report Status PENDING  Incomplete  Blood culture (routine x 2)     Status: None (Preliminary result)   Collection Time: 04/04/19 12:20 PM   Specimen: BLOOD RIGHT HAND  Result Value Ref Range Status   Specimen Description BLOOD RIGHT HAND  Final   Special Requests   Final    BOTTLES DRAWN AEROBIC ONLY Blood Culture results may not be optimal due to an inadequate volume of blood received in culture bottles   Culture   Final    NO GROWTH < 24 HOURS Performed at [facility], [place] [address], [place], [place] [address]    Report Status PENDING  Incomplete  SARS Coronavirus 2 [facility] order, Performed in [facility] hospital lab) Nasopharyngeal Nasopharyngeal Swab     Status: Abnormal   Collection Time: 04/04/19  1:09 PM   Specimen: Nasopharyngeal Swab  Result Value Ref Range Status   SARS Coronavirus 2 POSITIVE (A) NEGATIVE Final    Comment: RESULT CALLED TO, READ BACK BY AND VERIFIED WITH: Cindy Hazy RN 14:30 04/04/19 ([email or web address]) (NOTE) If result is NEGATIVE SARS-CoV-2 target nucleic acids are NOT DETECTED. The SARS-CoV-2 RNA is generally detectable in upper and lower  respiratory specimens during the acute phase of infection. The lowest  concentration of SARS-CoV-2 viral copies this assay can detect is 250   copies / mL. A negative result does not preclude SARS-CoV-2 infection  and should not be used as the sole basis for treatment or other  patient management decisions.  A negative result may occur with  improper specimen collection / handling, submission of specimen other  than nasopharyngeal swab, presence of viral mutation(s) within the  areas targeted by this assay, and inadequate number of viral copies  (<250 copies / mL). A negative result must be combined with clinical  observations, patient history, and epidemiological information. If result is POSITIVE SARS-CoV-2 target nucleic acids are DETECTED.  The SARS-CoV-2 RNA is generally detectable in upper and lower  respiratory specimens during the acute phase of infection.  Positive  results are indicative of active infection with SARS-CoV-2.  Clinical  correlation with patient history and other diagnostic information is  necessary to determine patient infection status.  Positive results do  not rule out bacterial infection or co-infection with other viruses. If result is PRESUMPTIVE POSTIVE SARS-CoV-2 nucleic acids MAY BE PRESENT.   A presumptive positive result was obtained on the submitted specimen  and confirmed on repeat testing.  While 2019 novel coronavirus  (SARS-CoV-2) nucleic acids may be present in the submitted sample  additional confirmatory testing may be necessary for epidemiological  and / or clinical management purposes  to differentiate between  SARS-CoV-2 and other Sarbecovirus currently known to infect humans.  If clinically indicated additional testing with an alternate test  methodology [phone])  is advised. The SARS-CoV-2 RNA is generally  detectable in upper and lower respiratory specimens during the acute  phase of infection. The expected result is Negative. Fact Sheet for Patients:  StrictlyIdeas.no Fact Sheet for Healthcare Providers: BankingDealers.co.za  This test is not yet approved or cleared by the [place] FDA and has been authorized for detection and/or diagnosis of SARS-CoV-2 by FDA under an Emergency Use Authorization (EUA).  This EUA will remain in effect (meaning this test can be used) for the  duration of the COVID-19 declaration under Section 564(b)(1) of the Act, 21 U.S.C. section 360bbb-3(b)(1), unless the authorization is terminated or revoked sooner. Performed at [facility], [place] [address], [place],  [address]       Radiology Studies: Dg Chest 2 View  Result Date: 04/04/2019 CLINICAL DATA:  Onset shortness of breath, weakness and diaphoresis this morning. EXAM: CHEST - 2 VIEW COMPARISON:  PA and lateral chest 12/13/2013 and 04/03/2019. FINDINGS: Hazy airspace disease is seen in the right middle lobe and lingula. Heart size is mildly enlarged. No pneumothorax or pleural fluid. No acute or focal bony abnormality. IMPRESSION: Hazy lingular and right middle lobe airspace disease could be due to pulmonary edema but has an appearance more worrisome for pneumonia including atypical/viral infection. Electronically Signed   By: Inge Rise M.D.   On: 04/04/2019 10:39   Dg Chest 2 View  Result Date: 04/03/2019 CLINICAL DATA:  Fever, shortness of breath, wheezing EXAM: CHEST - 2 VIEW COMPARISON:  12/13/2013 FINDINGS: Cardiomegaly with vascular congestion. Interstitial opacities in the perihilar and lower lobe regions likely early pulmonary edema. No effusions or acute bony abnormality. IMPRESSION: Cardiomegaly with vascular congestion and probable early interstitial edema. Electronically Signed   By: Rolm Baptise M.D.   On: 04/03/2019 12:24       LOS: 1 day   [facility] Pager on [email or web address]  04/05/2019, 11:39 AM

## 2019-04-06 DIAGNOSIS — R7401 Elevation of levels of liver transaminase levels: Secondary | ICD-10-CM

## 2019-04-06 LAB — COMPREHENSIVE METABOLIC PANEL
ALT: 85 U/L — ABNORMAL HIGH (ref 0–44)
AST: 127 U/L — ABNORMAL HIGH (ref 15–41)
Albumin: 3 g/dL — ABNORMAL LOW (ref 3.5–5.0)
Alkaline Phosphatase: 47 U/L (ref 38–126)
Anion gap: 10 (ref 5–15)
BUN: 26 mg/dL — ABNORMAL HIGH (ref 8–23)
CO2: 21 mmol/L — ABNORMAL LOW (ref 22–32)
Calcium: 8.2 mg/dL — ABNORMAL LOW (ref 8.9–10.3)
Chloride: 107 mmol/L (ref 98–111)
Creatinine, Ser: 1.11 mg/dL (ref 0.61–1.24)
GFR calc Af Amer: >60 mL/min (ref >60)
GFR calc non Af Amer: >60 mL/min (ref >60)
Glucose, Bld: 109 mg/dL — ABNORMAL HIGH (ref 70–99)
Potassium: 3.8 mmol/L (ref 3.5–5.1)
Sodium: 138 mmol/L (ref 135–145)
Total Bilirubin: 1 mg/dL (ref 0.3–1.2)
Total Protein: 6.4 g/dL — ABNORMAL LOW (ref 6.5–8.1)

## 2019-04-06 LAB — MAGNESIUM: Magnesium: 2.2 mg/dL (ref 1.7–2.4)

## 2019-04-06 LAB — FERRITIN: Ferritin: 488 ng/mL — ABNORMAL HIGH (ref 24–336)

## 2019-04-06 LAB — CBC
HCT: 40.5 % (ref 39.0–52.0)
Hemoglobin: 13.4 g/dL (ref 13.0–17.0)
MCH: 28 pg (ref 26.0–34.0)
MCHC: 33.1 g/dL (ref 30.0–36.0)
MCV: 84.7 fL (ref 80.0–100.0)
Platelets: 111 10*3/uL — ABNORMAL LOW (ref 150–400)
RBC: 4.78 MIL/uL (ref 4.22–5.81)
RDW: 12.6 % (ref 11.5–15.5)
WBC: 2.8 10*3/uL — ABNORMAL LOW (ref 4.0–10.5)
nRBC: 0 % (ref 0.0–0.2)

## 2019-04-06 LAB — C-REACTIVE PROTEIN: CRP: 2.4 mg/dL — ABNORMAL HIGH (ref <1.0)

## 2019-04-06 LAB — NOVEL CORONAVIRUS, NAA (HOSP ORDER, SEND-OUT TO REF LAB; TAT 18-24 HRS): SARS-CoV-2, NAA: NOT DETECTED

## 2019-04-06 NOTE — Progress Notes (Signed)
Patient declines for nursing staff to update family.  Patient states he has been in touch with family and is updating them personally.

## 2019-04-06 NOTE — Progress Notes (Signed)
Patient is updating his only family, declined nursing staffs offer to update family.

## 2019-04-06 NOTE — Progress Notes (Signed)
PROGRESS NOTE  Brandon Colon [id number] DOB: 1955-11-08 DOA: 04/04/2019  PCP: [name], [name]  Brief History/Interval Summary: 63 y.o. male with past medical history significant for hypertension was in his usual state of health until 4 days prior to admission when he started developing body aches, fever and chills.  Also had shortness of breath.  He presented to the emergency department.  He was positive for COVID-19.  Chest x-ray showed increased interstitial markers suggesting atypical pneumonia.  He was initially saturating normal on room air.  He was hospitalized for further management.  Subsequently he started becoming hypoxic.   Reason for Visit: Pneumonia due to COVID-19  Consultants: None  Procedures:   Antibiotics: Anti-infectives (From admission, onward)   Start     Dose/Rate Route Frequency Ordered Stop   04/06/19 1000  remdesivir 100 mg in sodium chloride 0.9 % 250 mL IVPB     100 mg 500 mL/hr over 30 Minutes Intravenous Every 24 hours 04/05/19 1147 04/10/19 0959   04/05/19 1300  remdesivir 200 mg in sodium chloride 0.9 % 250 mL IVPB     200 mg 500 mL/hr over 30 Minutes Intravenous Once 04/05/19 1147 04/05/19 1344   04/04/19 1345  cefTRIAXone (ROCEPHIN) 1 g in sodium chloride 0.9 % 100 mL IVPB     1 g 200 mL/hr over 30 Minutes Intravenous  Once 04/04/19 1338 04/04/19 1455   04/04/19 1345  azithromycin (ZITHROMAX) 500 mg in sodium chloride 0.9 % 250 mL IVPB  Status:  Discontinued     500 mg 250 mL/hr over 60 Minutes Intravenous  Once 04/04/19 1338 04/05/19 1138      Subjective/Interval History: Patient states that he is feeling slightly better today compared to yesterday.  Not as short of breath.  Occasional dry cough.  No nausea vomiting.      Assessment/Plan:  Acute Hypoxic Resp. Failure due to Acute Covid 19 Viral Illness  COVID-19 Labs  Recent Labs    04/04/19 1515 04/06/19 0108  DDIMER 1.96*  --   FERRITIN 597* 488*  LDH 683*  --   CRP 1.8*  2.4*    Lab Results  Component Value Date   SARSCOV2NAA POSITIVE (A) 04/04/2019   SARSCOV2NAA NOT DETECTED 04/03/2019     Fever: Remains afebrile Oxygen requirements: Oxygen being weaned down.  Currently on room air saturating in the early 90s. Antibacterials: Was given ceftriaxone and azithromycin but not continued. Remdesivir: Day 2 today Steroids: Dexamethasone 6 mg daily Diuretics: Not on a regular basis Actemra: Not indicated yet Vitamin C and Zinc: Continue DVT Prophylaxis:  Lovenox 40 mg daily  Patient's respiratory status appears to be improving.  He has been weaned down this morning to room air.  Continue to monitor closely.  Continue Remdesivir and steroids.  Inflammatory markers noted to be slightly elevated.  Check his d-dimer as well.  It was 1.96 yesterday.  Continue Lovenox.  Continue prone positioning, incentive spirometry and mobilization as much as possible. HIV nonreactive.  Hypokalemia Repleted.  Transaminitis Most likely due to COVID-19.  LFTs are stable this morning.  Continue to monitor.    Leukopenia Most likely due to acute illness from COVID-19.  Continue to monitor.  Mild thrombocytopenia Stable.  No evidence of bleeding.  Continue to monitor.  Hyponatremia Resolved.  Essential hypertension Blood pressure is reasonably well controlled.  Continue lisinopril.    Obesity Estimated body mass index is 30.51 kg/m as calculated from the following:   Height as of this encounter:  5\' 9"  (1.753 m).   Weight as of this encounter: 93.7 kg.   DVT Prophylaxis: Lovenox PUD Prophylaxis: Pepcid Code Status: Full code Family Communication: Discussed with the patient.  Attempts to call her sister yesterday were not successful.  We will try again today. Disposition Plan: Hopefully home when ready for discharge.  Will need to complete course of Remdesivir.     Medications:  Scheduled: . dexamethasone (DECADRON) injection  6 mg Intravenous Q24H  .  enoxaparin (LOVENOX) injection  40 mg Subcutaneous QHS  . famotidine  20 mg Oral Daily  . influenza vac split quadrivalent PF  0.5 mL Intramuscular Tomorrow-1000  . lisinopril  40 mg Oral Daily  . vitamin C  500 mg Oral Daily  . zinc sulfate  220 mg Oral Daily   Continuous: . sodium chloride Stopped (04/05/19 1303)  . remdesivir 100 mg in NS 250 mL 100 mg (04/06/19 0936)   [id number] **OR** acetaminophen   Objective:  Vital Signs  Vitals:   04/05/19 1609 04/05/19 2005 04/06/19 0427 04/06/19 0800  BP: 110/83 117/68 (!) 157/84 (!) 132/93  Pulse: 92 88 (!) 38 82  Resp: (!) 24 (!) 22 (!) 24 (!) 22  Temp: 99 F (37.2 C) 98.3 F (36.8 C) 99.3 F (37.4 C) 98 F (36.7 C)  TempSrc: Oral Oral Oral Oral  SpO2: 95% 95% 93% 90%  Weight:      Height:        Intake/Output Summary (Last 24 hours) at 04/06/2019 1020 Last data filed at 04/06/2019 0854 Gross per 24 hour  Intake 1008.92 ml  Output -  Net 1008.92 ml   Filed Weights   04/05/19 0000  Weight: 93.7 kg    General appearance: Awake alert.  In no distress Resp: Normal effort at rest.  Coarse breath sounds bilaterally.  Crackles at the bases bilaterally.  No wheezing or rhonchi.   Cardio: S1-S2 is normal regular.  No S3-S4.  No rubs murmurs or bruit GI: Abdomen is soft.  Nontender nondistended.  Bowel sounds are present normal.  No masses organomegaly Extremities: No edema.  Full range of motion of lower extremities. Neurologic: Alert and oriented x3.  No focal neurological deficits.     Lab Results:  Data Reviewed: I have personally reviewed following labs and imaging studies  CBC: Recent Labs  Lab 04/04/19 1010 04/05/19 0545 04/06/19 0108  WBC 3.9* 3.2* 2.8*  HGB 15.2 13.9 13.4  HCT 43.9 40.7 40.5  MCV 82.5 83.1 84.7  PLT 98* 103* 111*    Basic Metabolic Panel: Recent Labs  Lab 04/04/19 1010 04/04/19 1200 04/04/19 1515 04/05/19 0545 04/06/19 0108  NA 132*  --  134* 136 138  K 2.7*  --  3.0*  3.2* 3.8  CL 95*  --  97* 104 107  CO2 23  --  24 24 21*  GLUCOSE 101*  --  88 99 109*  BUN 25*  --  23 21 26*  CREATININE 1.50*  --  1.31* 1.06 1.11  CALCIUM 8.2*  --  7.8* 7.6* 8.2*  MG  --  2.1  --   --  2.2    GFR: Estimated Creatinine Clearance: 77 mL/min (by C-G formula based on SCr of 1.11 mg/dL).  Liver Function Tests: Recent Labs  Lab 04/04/19 1515 04/05/19 0545 04/06/19 0108  AST 222* 176* 127*  ALT 101* 87* 85*  ALKPHOS 60 50 47  BILITOT 1.8* 1.5* 1.0  PROT 6.7 6.4* 6.4*  ALBUMIN 3.2* 3.0*  3.0*     Lipid Profile: Recent Labs    04/04/19 1452  TRIG 166*     Anemia Panel: Recent Labs    04/04/19 1515 04/06/19 0108  FERRITIN 597* 488*    Recent Results (from the past 240 hour(s))  Novel Coronavirus, NAA (Hosp order, Send-out to Ref Lab; TAT 18-24 hrs     Status: None   Collection Time: 04/03/19 11:45 AM   Specimen: Nasopharyngeal Swab; Respiratory  Result Value Ref Range Status   SARS-CoV-2, NAA NOT DETECTED NOT DETECTED Final    Comment: (NOTE) This nucleic acid amplification test was developed and its performance characteristics determined by [employer]. Nucleic acid amplification tests include PCR and TMA. This test has not been FDA cleared or approved. This test has been authorized by FDA under an Emergency Use Authorization (EUA). This test is only authorized for the duration of time the declaration that circumstances exist justifying the authorization of the emergency use of in vitro diagnostic tests for detection of SARS-CoV-2 virus and/or diagnosis of COVID-19 infection under section 564(b)(1) of the Act, 21 U.S.C. [id number]) (1), unless the authorization is terminated or revoked sooner. When diagnostic testing is negative, the possibility of a false negative result should be considered in the context of a patient's recent exposures and the presence of clinical signs and symptoms consistent with COVID-19. An individual  without symptoms of COVID- 19 and who is not shedding SARS-CoV-2 vi rus would expect to have a negative (not detected) result in this assay. Performed At: [facility] [address] [place], [place] [id number] Rush Farmer MD [id number]    [place]  Final    Comment: Performed at [facility], [place] [address] [place], [place] [address]  Blood culture (routine x 2)     Status: None (Preliminary result)   Collection Time: 04/04/19 12:17 PM   Specimen: BLOOD LEFT HAND  Result Value Ref Range Status   Specimen Description BLOOD LEFT HAND  Final   Special Requests   Final    BOTTLES DRAWN AEROBIC AND ANAEROBIC Blood Culture results may not be optimal due to an inadequate volume of blood received in culture bottles   Culture   Final    NO GROWTH 2 DAYS Performed at [facility], [place] [address], [place], [place] [address]    Report Status PENDING  Incomplete  Blood culture (routine x 2)     Status: None (Preliminary result)   Collection Time: 04/04/19 12:20 PM   Specimen: BLOOD RIGHT HAND  Result Value Ref Range Status   Specimen Description BLOOD RIGHT HAND  Final   Special Requests   Final    BOTTLES DRAWN AEROBIC ONLY Blood Culture results may not be optimal due to an inadequate volume of blood received in culture bottles   Culture   Final    NO GROWTH 2 DAYS Performed at [facility], [place] [address] [place], [place] [address]    Report Status PENDING  Incomplete  SARS Coronavirus 2 [facility] order, Performed in [facility] hospital lab) Nasopharyngeal Nasopharyngeal Swab     Status: Abnormal   Collection Time: 04/04/19  1:09 PM   Specimen: Nasopharyngeal Swab  Result Value Ref Range Status   SARS Coronavirus 2 POSITIVE (A) NEGATIVE Final    Comment: RESULT CALLED TO, READ BACK BY AND VERIFIED WITH: Cindy Hazy RN 14:30 04/04/19 ([email or web address]) (NOTE) If result is NEGATIVE SARS-CoV-2 target nucleic acids are NOT DETECTED. The  SARS-CoV-2  RNA is generally detectable in upper and lower  respiratory specimens during the acute phase of infection. The lowest  concentration of SARS-CoV-2 viral copies this assay can detect is 250  copies / mL. A negative result does not preclude SARS-CoV-2 infection  and should not be used as the sole basis for treatment or other  patient management decisions.  A negative result may occur with  improper specimen collection / handling, submission of specimen other  than nasopharyngeal swab, presence of viral mutation(s) within the  areas targeted by this assay, and inadequate number of viral copies  (<250 copies / mL). A negative result must be combined with clinical  observations, patient history, and epidemiological information. If result is POSITIVE SARS-CoV-2 target nucleic acids are DETECTED.  The SARS-CoV-2 RNA is generally detectable in upper and lower  respiratory specimens during the acute phase of infection.  Positive  results are indicative of active infection with SARS-CoV-2.  Clinical  correlation with patient history and other diagnostic information is  necessary to determine patient infection status.  Positive results do  not rule out bacterial infection or co-infection with other viruses. If result is PRESUMPTIVE POSTIVE SARS-CoV-2 nucleic acids MAY BE PRESENT.   A presumptive positive result was obtained on the submitted specimen  and confirmed on repeat testing.  While 2019 novel coronavirus  (SARS-CoV-2) nucleic acids may be present in the submitted sample  additional confirmatory testing may be necessary for epidemiological  and / or clinical management purposes  to differentiate between  SARS-CoV-2 and other Sarbecovirus currently known to infect humans.  If clinically indicated additional testing with an alternate test  methodology [phone])  is advised. The SARS-CoV-2 RNA is generally  detectable in upper and lower respiratory specimens during the acute   phase of infection. The expected result is Negative. Fact Sheet for Patients:  StrictlyIdeas.no Fact Sheet for Healthcare Providers: BankingDealers.co.za This test is not yet approved or cleared by the [place] FDA and has been authorized for detection and/or diagnosis of SARS-CoV-2 by FDA under an Emergency Use Authorization (EUA).  This EUA will remain in effect (meaning this test can be used) for the duration of the COVID-19 declaration under Section 564(b)(1) of the Act, 21 U.S.C. section 360bbb-3(b)(1), unless the authorization is terminated or revoked sooner. Performed at [facility], [place] [address] [place], [place] [address]       Radiology Studies: Dg Chest 2 View  Result Date: 04/04/2019 CLINICAL DATA:  Onset shortness of breath, weakness and diaphoresis this morning. EXAM: CHEST - 2 VIEW COMPARISON:  PA and lateral chest 12/13/2013 and 04/03/2019. FINDINGS: Hazy airspace disease is seen in the right middle lobe and lingula. Heart size is mildly enlarged. No pneumothorax or pleural fluid. No acute or focal bony abnormality. IMPRESSION: Hazy lingular and right middle lobe airspace disease could be due to pulmonary edema but has an appearance more worrisome for pneumonia including atypical/viral infection. Electronically Signed   By: Inge Rise M.D.   On: 04/04/2019 10:39       LOS: 2 days   [facility] Pager on [email or web address]  04/06/2019, 10:20 AM

## 2019-04-07 LAB — CBC
HCT: 38.8 % — ABNORMAL LOW (ref 39.0–52.0)
Hemoglobin: 13 g/dL (ref 13.0–17.0)
MCH: 28.4 pg (ref 26.0–34.0)
MCHC: 33.5 g/dL (ref 30.0–36.0)
MCV: 84.9 fL (ref 80.0–100.0)
Platelets: 120 10*3/uL — ABNORMAL LOW (ref 150–400)
RBC: 4.57 MIL/uL (ref 4.22–5.81)
RDW: 12.8 % (ref 11.5–15.5)
WBC: 3.5 10*3/uL — ABNORMAL LOW (ref 4.0–10.5)
nRBC: 0 % (ref 0.0–0.2)

## 2019-04-07 LAB — COMPREHENSIVE METABOLIC PANEL
ALT: 81 U/L — ABNORMAL HIGH (ref 0–44)
AST: 81 U/L — ABNORMAL HIGH (ref 15–41)
Albumin: 3 g/dL — ABNORMAL LOW (ref 3.5–5.0)
Alkaline Phosphatase: 57 U/L (ref 38–126)
Anion gap: 8 (ref 5–15)
BUN: 22 mg/dL (ref 8–23)
CO2: 24 mmol/L (ref 22–32)
Calcium: 8.5 mg/dL — ABNORMAL LOW (ref 8.9–10.3)
Chloride: 109 mmol/L (ref 98–111)
Creatinine, Ser: 0.86 mg/dL (ref 0.61–1.24)
GFR calc Af Amer: >60 mL/min (ref >60)
GFR calc non Af Amer: >60 mL/min (ref >60)
Glucose, Bld: 143 mg/dL — ABNORMAL HIGH (ref 70–99)
Potassium: 3.6 mmol/L (ref 3.5–5.1)
Sodium: 141 mmol/L (ref 135–145)
Total Bilirubin: 0.7 mg/dL (ref 0.3–1.2)
Total Protein: 6.3 g/dL — ABNORMAL LOW (ref 6.5–8.1)

## 2019-04-07 LAB — D-DIMER, QUANTITATIVE: D-Dimer, Quant: 0.72 ug/mL-FEU — ABNORMAL HIGH (ref 0.00–0.50)

## 2019-04-07 LAB — FERRITIN: Ferritin: 418 ng/mL — ABNORMAL HIGH (ref 24–336)

## 2019-04-07 LAB — C-REACTIVE PROTEIN: CRP: 1.7 mg/dL — ABNORMAL HIGH (ref <1.0)

## 2019-04-07 LAB — MAGNESIUM: Magnesium: 1.9 mg/dL (ref 1.7–2.4)

## 2019-04-07 MED ORDER — POTASSIUM CHLORIDE CRYS ER 20 MEQ PO TBCR
40.0000 meq | EXTENDED_RELEASE_TABLET | Freq: Once | ORAL | Status: AC
  Administered 2019-04-07: 11:00:00 40 meq via ORAL
  Filled 2019-04-07: qty 2

## 2019-04-07 NOTE — Progress Notes (Signed)
PROGRESS NOTE  Brandon Colon [id number] DOB: [birth year] DOA: 04/04/2019  PCP: [name], [name]  Brief History/Interval Summary: 63 y.o. male with past medical history significant for hypertension was in his usual state of health until 4 days prior to admission when he started developing body aches, fever and chills.  Also had shortness of breath.  He presented to the emergency department.  He was positive for COVID-19.  Chest x-ray showed increased interstitial markers suggesting atypical pneumonia.  He was initially saturating normal on room air.  He was hospitalized for further management.  Subsequently he started becoming hypoxic.   Reason for Visit: Pneumonia due to COVID-19  Consultants: None  Procedures:   Antibiotics: Anti-infectives (From admission, onward)   Start     Dose/Rate Route Frequency Ordered Stop   04/06/19 1000  remdesivir 100 mg in sodium chloride 0.9 % 250 mL IVPB     100 mg 500 mL/hr over 30 Minutes Intravenous Every 24 hours 04/05/19 1147 04/10/19 0959   04/05/19 1300  remdesivir 200 mg in sodium chloride 0.9 % 250 mL IVPB     200 mg 500 mL/hr over 30 Minutes Intravenous Once 04/05/19 1147 04/05/19 1344   04/04/19 1345  cefTRIAXone (ROCEPHIN) 1 g in sodium chloride 0.9 % 100 mL IVPB     1 g 200 mL/hr over 30 Minutes Intravenous  Once 04/04/19 1338 04/04/19 1455   04/04/19 1345  azithromycin (ZITHROMAX) 500 mg in sodium chloride 0.9 % 250 mL IVPB  Status:  Discontinued     500 mg 250 mL/hr over 60 Minutes Intravenous  Once 04/04/19 1338 04/05/19 1138      Subjective/Interval History: Patient states that he is feeling better.  Not as short of breath as before.  Able to ambulate in the room.  Occasional dry cough.  No nausea vomiting.     Assessment/Plan:  Acute Hypoxic Resp. Failure due to Acute Covid 19 Viral Illness  COVID-19 Labs  Recent Labs    04/04/19 1515 04/06/19 0108 04/07/19 0044  DDIMER 1.96*  --  0.72*  FERRITIN 597* 488*  418*  LDH 683*  --   --   CRP 1.8* 2.4* 1.7*    Lab Results  Component Value Date   SARSCOV2NAA POSITIVE (A) 04/04/2019   SARSCOV2NAA NOT DETECTED 04/03/2019     Fever: Remains afebrile. Oxygen requirements: Now on room air.  Saturating in the 90s.   Antibacterials: Was given ceftriaxone and azithromycin but not continued. Remdesivir: Day 3 today Steroids: Dexamethasone 6 mg daily Diuretics: Not on a regular basis Actemra: Has not required it yet Vitamin C and Zinc: Continue DVT Prophylaxis:  Lovenox 40 mg daily  Patient's respiratory status appears to be improving.  He is now on room air saturating in the early 90s.  He has been able to ambulate.  His inflammatory markers have improved.  D-dimer 0.72.  He will be continued on Remdesivir to complete a 5-day course.  Continue Lovenox.  Continue to mobilize.  Continue incentive spirometry and prone positioning as much as possible. HIV nonreactive.  Hypokalemia Will be repleted  Transaminitis Most likely due to COVID-19.  LFTs have been improving.    Leukopenia Most likely due to acute illness from COVID-19.  Continue to monitor.  Mild thrombocytopenia  Most likely due to acute illness. Platelet counts have improved.   Hyponatremia Resolved.  Essential hypertension Blood pressure is reasonably well controlled.  Continue lisinopril.    Obesity Estimated body mass index is 30.51 kg/m  as calculated from the following:   Height as of this encounter: 5\' 9"  (1.753 m).   Weight as of this encounter: 93.7 kg.   DVT Prophylaxis: Lovenox PUD Prophylaxis: Pepcid Code Status: Full code Family Communication: Discussed with the patient.  Multiple attempts to call his family members have not been successful.  Patient states that he will update them himself.   Disposition Plan: Hopefully home when ready for discharge.  Will need to complete course of Remdesivir.     Medications:  Scheduled: . dexamethasone (DECADRON) injection   6 mg Intravenous Q24H  . enoxaparin (LOVENOX) injection  40 mg Subcutaneous QHS  . famotidine  20 mg Oral Daily  . influenza vac split quadrivalent PF  0.5 mL Intramuscular Tomorrow-1000  . lisinopril  40 mg Oral Daily  . vitamin C  500 mg Oral Daily  . zinc sulfate  220 mg Oral Daily   Continuous: . sodium chloride Stopped (04/05/19 1303)  . remdesivir 100 mg in NS 250 mL 100 mg (04/07/19 0908)   [id number] **OR** acetaminophen   Objective:  Vital Signs  Vitals:   04/06/19 1900 04/07/19 0357 04/07/19 0401 04/07/19 0731  BP: 133/89 (!) 141/106 (!) 131/100 (!) 154/107  Pulse:  64  83  Resp:  19  20  Temp: 98.4 F (36.9 C) 97.7 F (36.5 C)  (!) 97.3 F (36.3 C)  TempSrc: Oral Oral  Oral  SpO2: 96% 96%  95%  Weight:      Height:        Intake/Output Summary (Last 24 hours) at 04/07/2019 1000 Last data filed at 04/07/2019 0700 Gross per 24 hour  Intake 970 ml  Output 1600 ml  Net -630 ml   Filed Weights   04/05/19 0000  Weight: 93.7 kg    General appearance: Awake alert.  In no distress Resp: Improved aeration.  Normal effort at rest.  Coarse breath sounds bilaterally with a few crackles at the bases.  No wheezing or rhonchi.   Cardio: S1-S2 is normal regular.  No S3-S4.  No rubs murmurs or bruit GI: Abdomen is soft.  Nontender nondistended.  Bowel sounds are present normal.  No masses organomegaly Extremities: No edema.  Full range of motion of lower extremities. Neurologic: Alert and oriented x3.  No focal neurological deficits.     Lab Results:  Data Reviewed: I have personally reviewed following labs and imaging studies  CBC: Recent Labs  Lab 04/04/19 1010 04/05/19 0545 04/06/19 0108 04/07/19 0044  WBC 3.9* 3.2* 2.8* 3.5*  HGB 15.2 13.9 13.4 13.0  HCT 43.9 40.7 40.5 38.8*  MCV 82.5 83.1 84.7 84.9  PLT 98* 103* 111* 120*    Basic Metabolic Panel: Recent Labs  Lab 04/04/19 1010 04/04/19 1200 04/04/19 1515 04/05/19 0545 04/06/19 0108  04/07/19 0044  NA 132*  --  134* 136 138 141  K 2.7*  --  3.0* 3.2* 3.8 3.6  CL 95*  --  97* 104 107 109  CO2 23  --  24 24 21* 24  GLUCOSE 101*  --  88 99 109* 143*  BUN 25*  --  23 21 26* 22  CREATININE 1.50*  --  1.31* 1.06 1.11 0.86  CALCIUM 8.2*  --  7.8* 7.6* 8.2* 8.5*  MG  --  2.1  --   --  2.2 1.9    GFR: Estimated Creatinine Clearance: 99.4 mL/min (by C-G formula based on SCr of 0.86 mg/dL).  Liver Function Tests: Recent Labs  Lab 04/04/19 1515 04/05/19 0545 04/06/19 0108 04/07/19 0044  AST 222* 176* 127* 81*  ALT 101* 87* 85* 81*  ALKPHOS 60 50 47 57  BILITOT 1.8* 1.5* 1.0 0.7  PROT 6.7 6.4* 6.4* 6.3*  ALBUMIN 3.2* 3.0* 3.0* 3.0*     Lipid Profile: Recent Labs    04/04/19 1452  TRIG 166*     Anemia Panel: Recent Labs    04/06/19 0108 04/07/19 0044  FERRITIN 488* 418*    Recent Results (from the past 240 hour(s))  Novel Coronavirus, NAA (Hosp order, Send-out to Ref Lab; TAT 18-24 hrs     Status: None   Collection Time: 04/03/19 11:45 AM   Specimen: Nasopharyngeal Swab; Respiratory  Result Value Ref Range Status   SARS-CoV-2, NAA NOT DETECTED NOT DETECTED Final    Comment: (NOTE) This nucleic acid amplification test was developed and its performance characteristics determined by [employer]. Nucleic acid amplification tests include PCR and TMA. This test has not been FDA cleared or approved. This test has been authorized by FDA under an Emergency Use Authorization (EUA). This test is only authorized for the duration of time the declaration that circumstances exist justifying the authorization of the emergency use of in vitro diagnostic tests for detection of SARS-CoV-2 virus and/or diagnosis of COVID-19 infection under section 564(b)(1) of the Act, 21 U.S.C. [id number]) (1), unless the authorization is terminated or revoked sooner. When diagnostic testing is negative, the possibility of a false negative result should be considered in  the context of a patient's recent exposures and the presence of clinical signs and symptoms consistent with COVID-19. An individual without symptoms of COVID- 19 and who is not shedding SARS-CoV-2 vi rus would expect to have a negative (not detected) result in this assay. Performed At: [facility] [address] [place], [place] [id number] Rush Farmer MD [id number]    [place]  Final    Comment: Performed at [facility], [place] [address], [place], [place] [address]  Blood culture (routine x 2)     Status: None (Preliminary result)   Collection Time: 04/04/19 12:17 PM   Specimen: BLOOD LEFT HAND  Result Value Ref Range Status   Specimen Description BLOOD LEFT HAND  Final   Special Requests   Final    BOTTLES DRAWN AEROBIC AND ANAEROBIC Blood Culture results may not be optimal due to an inadequate volume of blood received in culture bottles   Culture   Final    NO GROWTH 3 DAYS Performed at [facility], [place] [address] [place], [place] [address]    Report Status PENDING  Incomplete  Blood culture (routine x 2)     Status: None (Preliminary result)   Collection Time: 04/04/19 12:20 PM   Specimen: BLOOD RIGHT HAND  Result Value Ref Range Status   Specimen Description BLOOD RIGHT HAND  Final   Special Requests   Final    BOTTLES DRAWN AEROBIC ONLY Blood Culture results may not be optimal due to an inadequate volume of blood received in culture bottles   Culture   Final    NO GROWTH 3 DAYS Performed at [facility], [place] [address] [place], [place] [address]    Report Status PENDING  Incomplete  SARS Coronavirus 2 [facility] order, Performed in [facility] hospital lab) Nasopharyngeal Nasopharyngeal Swab     Status: Abnormal   Collection Time: 04/04/19  1:09 PM   Specimen: Nasopharyngeal Swab  Result Value Ref Range  Status   SARS Coronavirus 2 POSITIVE (A) NEGATIVE Final    Comment: RESULT CALLED TO, READ BACK BY AND VERIFIED  WITH: Cindy Hazy RN 14:30 04/04/19 ([email or web address]) (NOTE) If result is NEGATIVE SARS-CoV-2 target nucleic acids are NOT DETECTED. The SARS-CoV-2 RNA is generally detectable in upper and lower  respiratory specimens during the acute phase of infection. The lowest  concentration of SARS-CoV-2 viral copies this assay can detect is 250  copies / mL. A negative result does not preclude SARS-CoV-2 infection  and should not be used as the sole basis for treatment or other  patient management decisions.  A negative result may occur with  improper specimen collection / handling, submission of specimen other  than nasopharyngeal swab, presence of viral mutation(s) within the  areas targeted by this assay, and inadequate number of viral copies  (<250 copies / mL). A negative result must be combined with clinical  observations, patient history, and epidemiological information. If result is POSITIVE SARS-CoV-2 target nucleic acids are DETECTED.  The SARS-CoV-2 RNA is generally detectable in upper and lower  respiratory specimens during the acute phase of infection.  Positive  results are indicative of active infection with SARS-CoV-2.  Clinical  correlation with patient history and other diagnostic information is  necessary to determine patient infection status.  Positive results do  not rule out bacterial infection or co-infection with other viruses. If result is PRESUMPTIVE POSTIVE SARS-CoV-2 nucleic acids MAY BE PRESENT.   A presumptive positive result was obtained on the submitted specimen  and confirmed on repeat testing.  While 2019 novel coronavirus  (SARS-CoV-2) nucleic acids may be present in the submitted sample  additional confirmatory testing may be necessary for epidemiological  and / or clinical management purposes  to differentiate between  SARS-CoV-2 and other Sarbecovirus currently known to infect humans.  If clinically indicated additional testing with an alternate test  methodology  [phone])  is advised. The SARS-CoV-2 RNA is generally  detectable in upper and lower respiratory specimens during the acute  phase of infection. The expected result is Negative. Fact Sheet for Patients:  StrictlyIdeas.no Fact Sheet for Healthcare Providers: BankingDealers.co.za This test is not yet approved or cleared by the [place] FDA and has been authorized for detection and/or diagnosis of SARS-CoV-2 by FDA under an Emergency Use Authorization (EUA).  This EUA will remain in effect (meaning this test can be used) for the duration of the COVID-19 declaration under Section 564(b)(1) of the Act, 21 U.S.C. section 360bbb-3(b)(1), unless the authorization is terminated or revoked sooner. Performed at [facility], [place] [address], [place], [place] [address]       Radiology Studies: No results found.     LOS: 3 days   Claritza July [employer] on [email or web address]  04/07/2019, 10:00 AM

## 2019-04-07 NOTE — Progress Notes (Signed)
I asked patient if he wanted me to call his sister, Enid Derry, and update her on his progress.  He stated no and that he had already been talking with her during the day.  Earleen Reaper RN

## 2019-04-07 NOTE — Progress Notes (Signed)
Patient declines for nurse to update family.  Pt states he is in close contact with family by cell phone.

## 2019-04-07 NOTE — TOC Initial Note (Signed)
Transition of Care [facility]) - Initial/Assessment Note    Patient Details  Name: Brandon Colon MRN: [id number] Date of Birth: [birth year]  [facility] CM/SW Contact:    Ninfa Meeker, RN Phone Number: [phone] (working remotely)  04/07/2019, 12:06 PM  Clinical Narrative:   63 yr old gentleman admitted for treatment of COVID 19. Patient is from home. Case Manager has scheduled telephonic hospital follow up appointment for patient at 10:30am with a practitioner at [facility]. Appt has been placed on AVS. CM will continue to follow for any other discharge needs.                Expected Discharge Plan: Home/Self Care Barriers to Discharge: Continued Medical Work up   Patient Goals and CMS Choice        Expected Discharge Plan and Services Expected Discharge Plan: Home/Self Care                                              Prior Living Arrangements/Services                       Activities of Daily Living Home Assistive Devices/Equipment: None ADL Screening (condition at time of admission) Patient's cognitive ability adequate to safely complete daily activities?: Yes Is the patient deaf or have difficulty hearing?: No Does the patient have difficulty seeing, even when wearing glasses/contacts?: No Does the patient have difficulty concentrating, remembering, or making decisions?: No Patient able to express need for assistance with ADLs?: Yes Does the patient have difficulty dressing or bathing?: No Independently performs ADLs?: Yes (appropriate for developmental age) Does the patient have difficulty walking or climbing stairs?: No Weakness of Legs: None Weakness of Arms/Hands: None  Permission Sought/Granted                  Emotional Assessment              Admission diagnosis:  Special screening for malignant neoplasms, colon [Z12.11] Community acquired pneumonia, unspecified laterality  [J18.9] COVID-19 virus detected [U07.1] Patient Active Problem List   Diagnosis Date Noted  . COVID-19 virus infection 04/04/2019  . Hypokalemia   . Elevated troponin   . Acute renal insufficiency   . Transaminitis   . Right arm pain 02/11/2017  . Right knee pain 11/02/2016  . Mass of finger 04/30/2016  . Essential hypertension 03/31/2016  . 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 S/P knee replacement 01/08/2015  . Routine general medical examination at a health care facility 11/21/2014   PCP:  [name], [name] Pharmacy:   [facility] [place], [place] - [address] [place] [address] Phone: [phone] Fax: [phone]  [facility] [place], [place] [address] Phone: [phone] Fax: [phone]     Social Determinants of Health (SDOH) Interventions    Readmission Risk Interventions No flowsheet data found.

## 2019-04-08 LAB — COMPREHENSIVE METABOLIC PANEL
ALT: 78 U/L — ABNORMAL HIGH (ref 0–44)
AST: 61 U/L — ABNORMAL HIGH (ref 15–41)
Albumin: 2.9 g/dL — ABNORMAL LOW (ref 3.5–5.0)
Alkaline Phosphatase: 58 U/L (ref 38–126)
Anion gap: 9 (ref 5–15)
BUN: 20 mg/dL (ref 8–23)
CO2: 24 mmol/L (ref 22–32)
Calcium: 8.5 mg/dL — ABNORMAL LOW (ref 8.9–10.3)
Chloride: 108 mmol/L (ref 98–111)
Creatinine, Ser: 0.74 mg/dL (ref 0.61–1.24)
GFR calc Af Amer: >60 mL/min (ref >60)
GFR calc non Af Amer: >60 mL/min (ref >60)
Glucose, Bld: 126 mg/dL — ABNORMAL HIGH (ref 70–99)
Potassium: 4 mmol/L (ref 3.5–5.1)
Sodium: 141 mmol/L (ref 135–145)
Total Bilirubin: 0.8 mg/dL (ref 0.3–1.2)
Total Protein: 6.2 g/dL — ABNORMAL LOW (ref 6.5–8.1)

## 2019-04-08 LAB — CBC
HCT: 38.4 % — ABNORMAL LOW (ref 39.0–52.0)
Hemoglobin: 13 g/dL (ref 13.0–17.0)
MCH: 28.7 pg (ref 26.0–34.0)
MCHC: 33.9 g/dL (ref 30.0–36.0)
MCV: 84.8 fL (ref 80.0–100.0)
Platelets: 142 10*3/uL — ABNORMAL LOW (ref 150–400)
RBC: 4.53 MIL/uL (ref 4.22–5.81)
RDW: 12.6 % (ref 11.5–15.5)
WBC: 4.8 10*3/uL (ref 4.0–10.5)
nRBC: 0 % (ref 0.0–0.2)

## 2019-04-08 LAB — C-REACTIVE PROTEIN: CRP: 1.3 mg/dL — ABNORMAL HIGH (ref <1.0)

## 2019-04-08 LAB — MAGNESIUM: Magnesium: 1.8 mg/dL (ref 1.7–2.4)

## 2019-04-08 LAB — D-DIMER, QUANTITATIVE: D-Dimer, Quant: 0.77 ug/mL-FEU — ABNORMAL HIGH (ref 0.00–0.50)

## 2019-04-08 LAB — FERRITIN: Ferritin: 324 ng/mL (ref 24–336)

## 2019-04-08 NOTE — Progress Notes (Signed)
This note also relates to the following rows which could not be included: Pulse Rate - Cannot attach notes to unvalidated device data SpO2 - Cannot attach notes to unvalidated device data    04/08/19 0527  Oxygen Therapy  O2 Device Room Air  Pain Assessment  Pain Scale 0-10  Pain Score 0  Provider Notification  Provider Name/Title Dr. Bridgett Larsson  Date Provider Notified 04/08/19  Time Provider Notified [phone]  Notification Type Page  Notification Reason Change in status  Response No new orders  Date of Provider Response 04/08/19   B/P - 143/111 - Patient is asymptomatic.  No complaints of headache, dizziness, or chest pain.  Dr. Bridgett Larsson called and made aware.  Will continue to monitor patient.  Earleen Reaper RN

## 2019-04-08 NOTE — Progress Notes (Signed)
PROGRESS NOTE  Brandon Colon [id number] DOB: [birth year] DOA: 04/04/2019  PCP: [name], [name]  Brief History/Interval Summary: 63 y.o. male with past medical history significant for hypertension was in his usual state of health until 4 days prior to admission when he started developing body aches, fever and chills.  Also had shortness of breath.  He presented to the emergency department.  He was positive for COVID-19.  Chest x-ray showed increased interstitial markers suggesting atypical pneumonia.  He was initially saturating normal on room air.  He was hospitalized for further management.  Subsequently he started becoming hypoxic.   Reason for Visit: Pneumonia due to COVID-19  Consultants: None  Procedures:   Antibiotics: Anti-infectives (From admission, onward)   Start     Dose/Rate Route Frequency Ordered Stop   04/06/19 1000  remdesivir 100 mg in sodium chloride 0.9 % 250 mL IVPB     100 mg 500 mL/hr over 30 Minutes Intravenous Every 24 hours 04/05/19 1147 04/10/19 0959   04/05/19 1300  remdesivir 200 mg in sodium chloride 0.9 % 250 mL IVPB     200 mg 500 mL/hr over 30 Minutes Intravenous Once 04/05/19 1147 04/05/19 1344   04/04/19 1345  cefTRIAXone (ROCEPHIN) 1 g in sodium chloride 0.9 % 100 mL IVPB     1 g 200 mL/hr over 30 Minutes Intravenous  Once 04/04/19 1338 04/04/19 1455   04/04/19 1345  azithromycin (ZITHROMAX) 500 mg in sodium chloride 0.9 % 250 mL IVPB  Status:  Discontinued     500 mg 250 mL/hr over 60 Minutes Intravenous  Once 04/04/19 1338 04/05/19 1138      Subjective/Interval History: Patient states that he is feeling better.  Not as short of breath as before.  Able to ambulate in the room.  Occasional dry cough.  No nausea vomiting.     Assessment/Plan:  Acute Hypoxic Resp. Failure/Pneumonia due to COVID-19  COVID-19 Labs  Recent Labs    04/06/19 0108 04/07/19 0044 04/08/19 0115  DDIMER  --  0.72* 0.77*  FERRITIN 488* 418* 324  CRP  2.4* 1.7* 1.3*    Lab Results  Component Value Date   SARSCOV2NAA POSITIVE (A) 04/04/2019   SARSCOV2NAA NOT DETECTED 04/03/2019     Fever: Afebrile Oxygen requirements: On room air saturating in the 90s  Antibacterials: Was given ceftriaxone and azithromycin but not continued. Remdesivir: Day 4 today Steroids: Dexamethasone 6 mg daily Diuretics: Not on a regular basis Actemra: Has not required it yet Vitamin C and Zinc: Continue DVT Prophylaxis:  Lovenox 40 mg daily  Patient's respiratory status has improved.  He saturating normal on room air.  Feels better.  Has been ambulating.  Continue Remdesivir and steroids.  He will complete 5-day course of Remdesivir tomorrow.  Continue incentive spirometry and mobilization.  Inflammatory markers have improved.  Hopefully discharge tomorrow after his fifth dose. HIV nonreactive.  Hypokalemia Repleted.  Magnesium 1.8.  Transaminitis Most likely due to COVID-19.  LFTs have been improving.    Leukopenia Most likely due to acute illness from COVID-19.  WBC normal this morning.  Mild thrombocytopenia  Most likely due to acute illness. Platelet counts have improved.   Hyponatremia Resolved.  Essential hypertension Blood pressure is reasonably well controlled.  Continue lisinopril.    Obesity Estimated body mass index is 30.51 kg/m as calculated from the following:   Height as of this encounter: 5\' 9"  (1.753 m).   Weight as of this encounter: 93.7 kg.   DVT  Prophylaxis: Lovenox PUD Prophylaxis: Pepcid Code Status: Full code Family Communication: Discussed with the patient.  He will inform his family members.   Disposition Plan: Anticipate discharge home tomorrow after his fifth dose of Remdesivir.   Medications:  Scheduled: . dexamethasone (DECADRON) injection  6 mg Intravenous Q24H  . enoxaparin (LOVENOX) injection  40 mg Subcutaneous QHS  . famotidine  20 mg Oral Daily  . influenza vac split quadrivalent PF  0.5 mL  Intramuscular Tomorrow-1000  . lisinopril  40 mg Oral Daily  . vitamin C  500 mg Oral Daily  . zinc sulfate  220 mg Oral Daily   Continuous: . sodium chloride Stopped (04/05/19 1303)  . remdesivir 100 mg in NS 250 mL 100 mg (04/08/19 0927)   [id number] **OR** acetaminophen   Objective:  Vital Signs  Vitals:   04/08/19 0500 04/08/19 0517 04/08/19 0600 04/08/19 0900  BP: (!) 143/111   (!) 142/101  Pulse: 79 67 74   Resp:    16  Temp:  98.9 F (37.2 C)  98.1 F (36.7 C)  TempSrc:  Oral  Oral  SpO2: 100% 99% 98% 97%  Weight:      Height:        Intake/Output Summary (Last 24 hours) at 04/08/2019 0955 Last data filed at 04/08/2019 0600 Gross per 24 hour  Intake 1210 ml  Output 1550 ml  Net -340 ml   Filed Weights   04/05/19 0000  Weight: 93.7 kg    General appearance: Awake alert.  In no distress Resp: Improved air entry bilaterally.  Few crackles at the bases but mostly clear.  No wheezing or rhonchi.   Cardio: S1-S2 is normal regular.  No S3-S4.  No rubs murmurs or bruit GI: Abdomen is soft.  Nontender nondistended.  Bowel sounds are present normal.  No masses organomegaly Extremities: No edema.  Full range of motion of lower extremities. Neurologic: Alert and oriented x3.  No focal neurological deficits.     Lab Results:  Data Reviewed: I have personally reviewed following labs and imaging studies  CBC: Recent Labs  Lab 04/04/19 1010 04/05/19 0545 04/06/19 0108 04/07/19 0044 04/08/19 0115  WBC 3.9* 3.2* 2.8* 3.5* 4.8  HGB 15.2 13.9 13.4 13.0 13.0  HCT 43.9 40.7 40.5 38.8* 38.4*  MCV 82.5 83.1 84.7 84.9 84.8  PLT 98* 103* 111* 120* 142*    Basic Metabolic Panel: Recent Labs  Lab 04/04/19 1200 04/04/19 1515 04/05/19 0545 04/06/19 0108 04/07/19 0044 04/08/19 0115  NA  --  134* 136 138 141 141  K  --  3.0* 3.2* 3.8 3.6 4.0  CL  --  97* 104 107 109 108  CO2  --  24 24 21* 24 24  GLUCOSE  --  88 99 109* 143* 126*  BUN  --  23 21 26* 22  20  CREATININE  --  1.31* 1.06 1.11 0.86 0.74  CALCIUM  --  7.8* 7.6* 8.2* 8.5* 8.5*  MG 2.1  --   --  2.2 1.9 1.8    GFR: Estimated Creatinine Clearance: 106.8 mL/min (by C-G formula based on SCr of 0.74 mg/dL).  Liver Function Tests: Recent Labs  Lab 04/04/19 1515 04/05/19 0545 04/06/19 0108 04/07/19 0044 04/08/19 0115  AST 222* 176* 127* 81* 61*  ALT 101* 87* 85* 81* 78*  ALKPHOS 60 50 47 57 58  BILITOT 1.8* 1.5* 1.0 0.7 0.8  PROT 6.7 6.4* 6.4* 6.3* 6.2*  ALBUMIN 3.2* 3.0* 3.0* 3.0* 2.9*  Anemia Panel: Recent Labs    04/07/19 0044 04/08/19 0115  FERRITIN 418* 324    Recent Results (from the past 240 hour(s))  Novel Coronavirus, NAA (Hosp order, Send-out to Ref Lab; TAT 18-24 hrs     Status: None   Collection Time: 04/03/19 11:45 AM   Specimen: Nasopharyngeal Swab; Respiratory  Result Value Ref Range Status   SARS-CoV-2, NAA NOT DETECTED NOT DETECTED Final    Comment: (NOTE) This nucleic acid amplification test was developed and its performance characteristics determined by [employer]. Nucleic acid amplification tests include PCR and TMA. This test has not been FDA cleared or approved. This test has been authorized by FDA under an Emergency Use Authorization (EUA). This test is only authorized for the duration of time the declaration that circumstances exist justifying the authorization of the emergency use of in vitro diagnostic tests for detection of SARS-CoV-2 virus and/or diagnosis of COVID-19 infection under section 564(b)(1) of the Act, 21 U.S.C. [id number]) (1), unless the authorization is terminated or revoked sooner. When diagnostic testing is negative, the possibility of a false negative result should be considered in the context of a patient's recent exposures and the presence of clinical signs and symptoms consistent with COVID-19. An individual without symptoms of COVID- 19 and who is not shedding SARS-CoV-2 vi rus would expect to  have a negative (not detected) result in this assay. Performed At: [facility] [address] [place], [place] [id number] Rush Farmer MD [id number]    [place]  Final    Comment: Performed at [facility] [facility], [place] [address], [place], [place] [address]  Blood culture (routine x 2)     Status: None (Preliminary result)   Collection Time: 04/04/19 12:17 PM   Specimen: BLOOD LEFT HAND  Result Value Ref Range Status   Specimen Description BLOOD LEFT HAND  Final   Special Requests   Final    BOTTLES DRAWN AEROBIC AND ANAEROBIC Blood Culture results may not be optimal due to an inadequate volume of blood received in culture bottles   Culture   Final    NO GROWTH 3 DAYS Performed at [facility], [place] [address], [place], [place] [address]    Report Status PENDING  Incomplete  Blood culture (routine x 2)     Status: None (Preliminary result)   Collection Time: 04/04/19 12:20 PM   Specimen: BLOOD RIGHT HAND  Result Value Ref Range Status   Specimen Description BLOOD RIGHT HAND  Final   Special Requests   Final    BOTTLES DRAWN AEROBIC ONLY Blood Culture results may not be optimal due to an inadequate volume of blood received in culture bottles   Culture   Final    NO GROWTH 3 DAYS Performed at [facility], [place] [address], [place], [place] [address]    Report Status PENDING  Incomplete  SARS Coronavirus 2 [facility] order, Performed in [facility] hospital lab) Nasopharyngeal Nasopharyngeal Swab     Status: Abnormal   Collection Time: 04/04/19  1:09 PM   Specimen: Nasopharyngeal Swab  Result Value Ref Range Status   SARS Coronavirus 2 POSITIVE (A) NEGATIVE Final    Comment: RESULT CALLED TO, READ BACK BY AND VERIFIED WITH: Cindy Hazy RN 14:30 04/04/19 ([email or web address]) (NOTE) If result is NEGATIVE SARS-CoV-2 target nucleic acids are NOT DETECTED. The SARS-CoV-2 RNA is generally detectable in upper and lower  respiratory specimens during  the acute phase of infection. The lowest  concentration  of SARS-CoV-2 viral copies this assay can detect is 250  copies / mL. A negative result does not preclude SARS-CoV-2 infection  and should not be used as the sole basis for treatment or other  patient management decisions.  A negative result may occur with  improper specimen collection / handling, submission of specimen other  than nasopharyngeal swab, presence of viral mutation(s) within the  areas targeted by this assay, and inadequate number of viral copies  (<250 copies / mL). A negative result must be combined with clinical  observations, patient history, and epidemiological information. If result is POSITIVE SARS-CoV-2 target nucleic acids are DETECTED.  The SARS-CoV-2 RNA is generally detectable in upper and lower  respiratory specimens during the acute phase of infection.  Positive  results are indicative of active infection with SARS-CoV-2.  Clinical  correlation with patient history and other diagnostic information is  necessary to determine patient infection status.  Positive results do  not rule out bacterial infection or co-infection with other viruses. If result is PRESUMPTIVE POSTIVE SARS-CoV-2 nucleic acids MAY BE PRESENT.   A presumptive positive result was obtained on the submitted specimen  and confirmed on repeat testing.  While 2019 novel coronavirus  (SARS-CoV-2) nucleic acids may be present in the submitted sample  additional confirmatory testing may be necessary for epidemiological  and / or clinical management purposes  to differentiate between  SARS-CoV-2 and other Sarbecovirus currently known to infect humans.  If clinically indicated additional testing with an alternate test  methodology [phone])  is advised. The SARS-CoV-2 RNA is generally  detectable in upper and lower respiratory specimens during the acute  phase of infection. The expected result is Negative. Fact Sheet for Patients:   StrictlyIdeas.no Fact Sheet for Healthcare Providers: BankingDealers.co.za This test is not yet approved or cleared by the [place] FDA and has been authorized for detection and/or diagnosis of SARS-CoV-2 by FDA under an Emergency Use Authorization (EUA).  This EUA will remain in effect (meaning this test can be used) for the duration of the COVID-19 declaration under Section 564(b)(1) of the Act, 21 U.S.C. section 360bbb-3(b)(1), unless the authorization is terminated or revoked sooner. Performed at [facility], [place] [address], [place], [place] [address]       Radiology Studies: No results found.     LOS: 4 days   Tarique Loveall [employer] on [email or web address]  04/08/2019, 9:55 AM

## 2019-04-08 NOTE — Progress Notes (Signed)
Patient did not want to call sister, Enid Derry.  He called his sister, Hassan Rowan, on his cell phone but there was no answer.  I left my number if she had any questions or concerns.  Earleen Reaper RN

## 2019-04-09 LAB — CULTURE, BLOOD (ROUTINE X 2)
Culture: NO GROWTH
Culture: NO GROWTH

## 2019-04-09 MED ORDER — FAMOTIDINE 20 MG PO TABS: 20.0000 mg | ORAL_TABLET | Freq: Every day | ORAL | 0 refills | Status: AC

## 2019-04-09 MED ORDER — ASCORBIC ACID 500 MG PO TABS: 500.0000 mg | ORAL_TABLET | Freq: Every day | ORAL | 0 refills | Status: AC

## 2019-04-09 MED ORDER — DEXAMETHASONE 2 MG PO TABS: ORAL_TABLET | ORAL | 0 refills | Status: AC

## 2019-04-09 MED ORDER — ZINC SULFATE 220 (50 ZN) MG PO CAPS: 220.0000 mg | ORAL_CAPSULE | Freq: Every day | ORAL | 0 refills | Status: AC

## 2019-04-09 NOTE — Clinical Social Work Note (Signed)
Clinical Social Worker received notification from RN that patient was planning for [facility] for transport home.  CSW contacted [facility] to inquire about transportation of COVID positive patient - [facility] confirmed at this time they are not transporting COVID positive patients and would cancel patient transport set up for 3:30pm today.  CSW arranged ambulance transport via [facility] to patient home - patient and RN aware.  CSW completed necessary paperwork for transport and sent it to the printer for RN.  CSW remains available for support as needed.  Barbette Or, [place]

## 2019-04-09 NOTE — Discharge Summary (Signed)
[facility]  Physician Discharge Summary   Patient ID: Brandon Colon MRN: [id number] DOB/AGE: May 18, 1956 63 y.o.  Admit date: 04/04/2019 Discharge date: 04/09/2019  PCP: [name], [name]  DISCHARGE DIAGNOSES:  Pneumonia due to COVID-19 Acute respiratory failure with hypoxia, resolved Transaminitis Essential hypertension  RECOMMENDATIONS FOR OUTPATIENT FOLLOW UP: 1. Outpatient follow-up with PCP    Home Health: None Equipment/Devices: None  CODE STATUS: Full code  DISCHARGE CONDITION: fair  Diet recommendation: As before  INITIAL HISTORY: 63 y.o.malewith past medical history significant for hypertension was in his usual state of health until 4 days prior to admission when he started developing body aches, fever and chills.  Also had shortness of breath.  He presented to the emergency department.  He was positive for COVID-19.  Chest x-ray showed increased interstitial markers suggesting atypical pneumonia.  He was initially saturating normal on room air.  He was hospitalized for further management.  Subsequently he started becoming hypoxic.    HOSPITAL COURSE:   Acute Hypoxic Resp. Failure/Pneumonia due to COVID-19 Patient was hospitalized.  He was requiring 2 to 3 L of oxygen initially.  He was placed on Remdesivir and steroids.  He started improving.  He has been weaned down to room air.  He is ambulated without any difficulties.  Patient feels much better.  Inflammatory markers have improved.  HIV was nonreactive.  Patient continue with incentive spirometry at home.  He will be discharged on steroid taper.  He has completed course of Remdesivir.  Hypokalemia Repleted.  Magnesium 1.8.  Transaminitis Most likely due to COVID-19.  LFTs have been improving.    Leukopenia Most likely due to acute illness from COVID-19.   Resolved  Mild thrombocytopenia  Most likely due to acute illness. Platelet counts have improved.   Hyponatremia Resolved.   Essential hypertension Blood pressure is reasonably well controlled.  Continue lisinopril.    Obesity Estimated body mass index is 30.51 kg/m as calculated from the following:   Height as of this encounter: 5\' 9"  (1.753 m).   Weight as of this encounter: 93.7 kg.  Overall stable.  Okay for discharge today after his fifth dose of Remdesivir.   PERTINENT LABS:  The results of significant diagnostics from this hospitalization (including imaging, microbiology, ancillary and laboratory) are listed below for reference.    Microbiology: Recent Results (from the past 240 hour(s))  Novel Coronavirus, NAA (Hosp order, Send-out to Ref Lab; TAT 18-24 hrs     Status: None   Collection Time: 04/03/19 11:45 AM   Specimen: Nasopharyngeal Swab; Respiratory  Result Value Ref Range Status   SARS-CoV-2, NAA NOT DETECTED NOT DETECTED Final    Comment: (NOTE) This nucleic acid amplification test was developed and its performance characteristics determined by [employer]. Nucleic acid amplification tests include PCR and TMA. This test has not been FDA cleared or approved. This test has been authorized by FDA under an Emergency Use Authorization (EUA). This test is only authorized for the duration of time the declaration that circumstances exist justifying the authorization of the emergency use of in vitro diagnostic tests for detection of SARS-CoV-2 virus and/or diagnosis of COVID-19 infection under section 564(b)(1) of the Act, 21 U.S.C. [id number]) (1), unless the authorization is terminated or revoked sooner. When diagnostic testing is negative, the possibility of a false negative result should be considered in the context of a patient's recent exposures and the presence of clinical signs and symptoms consistent with COVID-19. An individual without symptoms of COVID- 19  and who is not shedding SARS-CoV-2 vi rus would expect to have a negative (not detected) result in this assay.  Performed At: [facility] [address] [place], [place] [id number] Rush Farmer MD [id number]    [place]  Final    Comment: Performed at [facility], [place] [address], [place], [place] [address]  Blood culture (routine x 2)     Status: None   Collection Time: 04/04/19 12:17 PM   Specimen: BLOOD LEFT HAND  Result Value Ref Range Status   Specimen Description BLOOD LEFT HAND  Final   Special Requests   Final    BOTTLES DRAWN AEROBIC AND ANAEROBIC Blood Culture results may not be optimal due to an inadequate volume of blood received in culture bottles   Culture   Final    NO GROWTH 5 DAYS Performed at [facility], [place] [address] [place], [place] [address]    Report Status 04/09/2019 FINAL  Final  Blood culture (routine x 2)     Status: None   Collection Time: 04/04/19 12:20 PM   Specimen: BLOOD RIGHT HAND  Result Value Ref Range Status   Specimen Description BLOOD RIGHT HAND  Final   Special Requests   Final    BOTTLES DRAWN AEROBIC ONLY Blood Culture results may not be optimal due to an inadequate volume of blood received in culture bottles   Culture   Final    NO GROWTH 5 DAYS Performed at [facility], [place] [address] [place],  [address]    Report Status 04/09/2019 FINAL  Final  SARS Coronavirus 2 [facility] order, Performed in [facility] hospital lab) Nasopharyngeal Nasopharyngeal Swab     Status: Abnormal   Collection Time: 04/04/19  1:09 PM   Specimen: Nasopharyngeal Swab  Result Value Ref Range Status   SARS Coronavirus 2 POSITIVE (A) NEGATIVE Final    Comment: RESULT CALLED TO, READ BACK BY AND VERIFIED WITH: Cindy Hazy RN 14:30 04/04/19 ([email or web address]) (NOTE) If result is NEGATIVE SARS-CoV-2 target nucleic acids are NOT DETECTED. The SARS-CoV-2 RNA is generally detectable in upper and lower  respiratory specimens during the acute phase of infection. The lowest  concentration of SARS-CoV-2 viral copies this  assay can detect is 250  copies / mL. A negative result does not preclude SARS-CoV-2 infection  and should not be used as the sole basis for treatment or other  patient management decisions.  A negative result may occur with  improper specimen collection / handling, submission of specimen other  than nasopharyngeal swab, presence of viral mutation(s) within the  areas targeted by this assay, and inadequate number of viral copies  (<250 copies / mL). A negative result must be combined with clinical  observations, patient history, and epidemiological information. If result is POSITIVE SARS-CoV-2 target nucleic acids are DETECTED.  The SARS-CoV-2 RNA is generally detectable in upper and lower  respiratory specimens during the acute phase of infection.  Positive  results are indicative of active infection with SARS-CoV-2.  Clinical  correlation with patient history and other diagnostic information is  necessary to determine patient infection status.  Positive results do  not rule out bacterial infection or co-infection with other viruses. If result is PRESUMPTIVE POSTIVE SARS-CoV-2 nucleic acids MAY BE PRESENT.   A presumptive positive result was obtained on the submitted specimen  and confirmed on repeat testing.  While 2019 novel coronavirus  (SARS-CoV-2) nucleic acids may be present in the submitted sample  additional confirmatory testing may be necessary for epidemiological  and / or clinical management purposes  to differentiate between  SARS-CoV-2 and other Sarbecovirus currently known to infect humans.  If clinically indicated additional testing with an alternate test  methodology [phone])  is advised. The SARS-CoV-2 RNA is generally  detectable in upper and lower respiratory specimens during the acute  phase of infection. The expected result is Negative. Fact Sheet for Patients:  StrictlyIdeas.no Fact Sheet for Healthcare Providers:  BankingDealers.co.za This test is not yet approved or cleared by the [place] FDA and has been authorized for detection and/or diagnosis of SARS-CoV-2 by FDA under an Emergency Use Authorization (EUA).  This EUA will remain in effect (meaning this test can be used) for the duration of the COVID-19 declaration under Section 564(b)(1) of the Act, 21 U.S.C. section 360bbb-3(b)(1), unless the authorization is terminated or revoked sooner. Performed at [facility], [place] [address] [place], [place] [address]      Labs:  COVID-19 Labs  Recent Labs    04/07/19 0044 04/08/19 0115  DDIMER 0.72* 0.77*  FERRITIN 418* 324  CRP 1.7* 1.3*    Lab Results  Component Value Date   SARSCOV2NAA POSITIVE (A) 04/04/2019   SARSCOV2NAA NOT DETECTED 04/03/2019      Basic Metabolic Panel: Recent Labs  Lab 04/04/19 1200 04/04/19 1515 04/05/19 0545 04/06/19 0108 04/07/19 0044 04/08/19 0115  NA  --  134* 136 138 141 141  K  --  3.0* 3.2* 3.8 3.6 4.0  CL  --  97* 104 107 109 108  CO2  --  24 24 21* 24 24  GLUCOSE  --  88 99 109* 143* 126*  BUN  --  23 21 26* 22 20  CREATININE  --  1.31* 1.06 1.11 0.86 0.74  CALCIUM  --  7.8* 7.6* 8.2* 8.5* 8.5*  MG 2.1  --   --  2.2 1.9 1.8   Liver Function Tests: Recent Labs  Lab 04/04/19 1515 04/05/19 0545 04/06/19 0108 04/07/19 0044 04/08/19 0115  AST 222* 176* 127* 81* 61*  ALT 101* 87* 85* 81* 78*  ALKPHOS 60 50 47 57 58  BILITOT 1.8* 1.5* 1.0 0.7 0.8  PROT 6.7 6.4* 6.4* 6.3* 6.2*  ALBUMIN 3.2* 3.0* 3.0* 3.0* 2.9*   CBC: Recent Labs  Lab 04/04/19 1010 04/05/19 0545 04/06/19 0108 04/07/19 0044 04/08/19 0115  WBC 3.9* 3.2* 2.8* 3.5* 4.8  HGB 15.2 13.9 13.4 13.0 13.0  HCT 43.9 40.7 40.5 38.8* 38.4*  MCV 82.5 83.1 84.7 84.9 84.8  PLT 98* 103* 111* 120* 142*   BNP: BNP (last 3 results) Recent Labs    04/04/19 1010  BNP 20.0    IMAGING STUDIES Dg Chest 2 View  Result Date: 04/04/2019  CLINICAL DATA:  Onset shortness of breath, weakness and diaphoresis this morning. EXAM: CHEST - 2 VIEW COMPARISON:  PA and lateral chest 12/13/2013 and 04/03/2019. FINDINGS: Hazy airspace disease is seen in the right middle lobe and lingula. Heart size is mildly enlarged. No pneumothorax or pleural fluid. No acute or focal bony abnormality. IMPRESSION: Hazy lingular and right middle lobe airspace disease could be due to pulmonary edema but has an appearance more worrisome for pneumonia including atypical/viral infection. Electronically Signed   By: Inge Rise M.D.   On: 04/04/2019 10:39   Dg Chest 2 View  Result Date: 04/03/2019 CLINICAL DATA:  Fever, shortness of breath, wheezing EXAM: CHEST - 2 VIEW COMPARISON:  12/13/2013 FINDINGS: Cardiomegaly  with vascular congestion. Interstitial opacities in the perihilar and lower lobe regions likely early pulmonary edema. No effusions or acute bony abnormality. IMPRESSION: Cardiomegaly with vascular congestion and probable early interstitial edema. Electronically Signed   By: Rolm Baptise M.D.   On: 04/03/2019 12:24    DISCHARGE EXAMINATION: Vitals:   04/09/19 0500 04/09/19 0555 04/09/19 0600 04/09/19 0900  BP:  (!) 139/100  (!) 150/106  Pulse: 73 73 70 74  Resp:  18  16  Temp:  98 F (36.7 C)  98.1 F (36.7 C)  TempSrc:  Oral  Oral  SpO2: 100% 98% 94% 96%  Weight:      Height:       General appearance: Awake alert.  In no distress Resp: Clear to auscultation bilaterally.  Normal effort Cardio: S1-S2 is normal regular.  No S3-S4.  No rubs murmurs or bruit GI: Abdomen is soft.  Nontender nondistended.  Bowel sounds are present normal.  No masses organomegaly Extremities: No edema.  Full range of motion of lower extremities. Neurologic: Alert and oriented x3.  No focal neurological deficits.    DISPOSITION: Home  Discharge Instructions    Call MD for:  difficulty breathing, headache or visual disturbances   Complete by: As directed     Call MD for:  extreme fatigue   Complete by: As directed    Call MD for:  persistant dizziness or light-headedness   Complete by: As directed    Call MD for:  persistant nausea and vomiting   Complete by: As directed    Call MD for:  severe uncontrolled pain   Complete by: As directed    Call MD for:  temperature >100.4   Complete by: As directed    Discharge instructions   Complete by: As directed    COVID 19 INSTRUCTIONS  - You are felt to be stable enough to no longer require inpatient monitoring, testing, and treatment, though you will need to follow the recommendations below: - Based on the CDC's non-test criteria for ending self-isolation: You may not return to work/leave the home until at least 21 days since symptom onset AND 3 days without a fever (without taking tylenol, ibuprofen, etc.) AND have improvement in respiratory symptoms. - Do not take NSAID medications (including, but not limited to, ibuprofen, advil, motrin, naproxen, aleve, goody's powder, etc.) - Follow up with your doctor in the next week via telehealth or seek medical attention right away if your symptoms get WORSE.  - Consider donating plasma after you have recovered (either 14 days after a negative test or 28 days after symptoms have completely resolved) because your antibodies to this virus may be helpful to give to others with life-threatening infections. Please go to the website www.[email or web address] if you would like to consider volunteering for plasma donation.    Directions for you at home:  Wear a facemask You should wear a facemask that covers your nose and mouth when you are in the same room with other people and when you visit a healthcare provider. People who live with or visit you should also wear a facemask while they are in the same room with you.  Separate yourself from other people in your home As much as possible, you should stay in a different room from other people in your home. Also, you should  use a separate bathroom, if available.  Avoid sharing household items You should not share dishes, drinking glasses, cups, eating utensils, towels, bedding, or other items with other  people in your home. After using these items, you should wash them thoroughly with soap and water.  Cover your coughs and sneezes Cover your mouth and nose with a tissue when you cough or sneeze, or you can cough or sneeze into your sleeve. Throw used tissues in a lined trash can, and immediately wash your hands with soap and water for at least 20 seconds or use an alcohol-based hand rub.  Wash your [facility] your hands often and thoroughly with soap and water for at least 20 seconds. You can use an alcohol-based hand sanitizer if soap and water are not available and if your hands are not visibly dirty. Avoid touching your eyes, nose, and mouth with unwashed hands.  Directions for those who live with, or provide care at home for you:  Limit the number of people who have contact with the patient If possible, have only one caregiver for the patient. Other household members should stay in another home or place of residence. If this is not possible, they should stay in another room, or be separated from the patient as much as possible. Use a separate bathroom, if available. Restrict visitors who do not have an essential need to be in the home.  Ensure good ventilation Make sure that shared spaces in the home have good air flow, such as from an air conditioner or an opened window, weather permitting.  Wash your hands often Wash your hands often and thoroughly with soap and water for at least 20 seconds. You can use an alcohol based hand sanitizer if soap and water are not available and if your hands are not visibly dirty. Avoid touching your eyes, nose, and mouth with unwashed hands. Use disposable paper towels to dry your hands. If not available, use dedicated cloth towels and replace them when they become  wet.  Wear a facemask and gloves Wear a disposable facemask at all times in the room and gloves when you touch or have contact with the patient's blood, body fluids, and/or secretions or excretions, such as sweat, saliva, sputum, nasal mucus, vomit, urine, or feces.  Ensure the mask fits over your nose and mouth tightly, and do not touch it during use. Throw out disposable facemasks and gloves after using them. Do not reuse. Wash your hands immediately after removing your facemask and gloves. If your personal clothing becomes contaminated, carefully remove clothing and launder. Wash your hands after handling contaminated clothing. Place all used disposable facemasks, gloves, and other waste in a lined container before disposing them with other household waste. Remove gloves and wash your hands immediately after handling these items.  Do not share dishes, glasses, or other household items with the patient Avoid sharing household items. You should not share dishes, drinking glasses, cups, eating utensils, towels, bedding, or other items with a patient who is confirmed to have, or being evaluated for, COVID-19 infection. After the person uses these items, you should wash them thoroughly with soap and water.  Wash laundry thoroughly Immediately remove and wash clothes or bedding that have blood, body fluids, and/or secretions or excretions, such as sweat, saliva, sputum, nasal mucus, vomit, urine, or feces, on them. Wear gloves when handling laundry from the patient. Read and follow directions on labels of laundry or clothing items and detergent. In general, wash and dry with the warmest temperatures recommended on the label.  Clean all areas the individual has used often Clean all touchable surfaces, such as counters, tabletops, doorknobs, bathroom fixtures, toilets, phones,  keyboards, tablets, and bedside tables, every day. Also, clean any surfaces that may have blood, body fluids, and/or  secretions or excretions on them. Wear gloves when cleaning surfaces the patient has come in contact with. Use a diluted bleach solution (e.g., dilute bleach with 1 part bleach and 10 parts water) or a household disinfectant with a label that says EPA-registered for coronaviruses. To make a bleach solution at home, add 1 tablespoon of bleach to 1 quart (4 cups) of water. For a larger supply, add  cup of bleach to 1 gallon (16 cups) of water. Read labels of cleaning products and follow recommendations provided on product labels. Labels contain instructions for safe and effective use of the cleaning product including precautions you should take when applying the product, such as wearing gloves or eye protection and making sure you have good ventilation during use of the product. Remove gloves and wash hands immediately after cleaning.  Monitor yourself for signs and symptoms of illness Caregivers and household members are considered close contacts, should monitor their health, and will be asked to limit movement outside of the home to the extent possible. Follow the monitoring steps for close contacts listed on the symptom monitoring form.   If you have additional questions, contact your local health department or call the epidemiologist on call at [phone] (available 24/7). This guidance is subject to change. For the most up-to-date guidance from [facility], please refer to their website: YouBlogs.pl   You were cared for by a hospitalist during your hospital stay. If you have any questions about your discharge medications or the care you received while you were in the hospital after you are discharged, you can call the unit and asked to speak with the hospitalist on call if the hospitalist that took care of you is not available. Once you are discharged, your primary care physician will handle any further medical issues. Please note that NO  REFILLS for any discharge medications will be authorized once you are discharged, as it is imperative that you return to your primary care physician (or establish a relationship with a primary care physician if you do not have one) for your aftercare needs so that they can reassess your need for medications and monitor your lab values. If you do not have a primary care physician, you can call [phone] for a physician referral.   Increase activity slowly   Complete by: As directed         Allergies as of 04/09/2019   No Known Allergies     Medication List    TAKE these medications   acetaminophen 500 MG tablet Commonly known as: TYLENOL Take 1,000 mg by mouth every 6 (six) hours as needed for mild pain.   ascorbic acid 500 MG tablet Commonly known as: VITAMIN C Take 1 tablet (500 mg total) by mouth daily for 10 days. Start taking on: April 10, 2019   dexamethasone 2 MG tablet Commonly known as: DECADRON Take 2 tablets once daily for 3 days, then 1 tablet once daily for 3 days, then STOP.   famotidine 20 MG tablet Commonly known as: PEPCID Take 1 tablet (20 mg total) by mouth daily for 10 days. Start taking on: April 10, 2019   lisinopril 40 MG tablet Commonly known as: ZESTRIL TAKE 1 TABLET(40 MG) BY MOUTH DAILY What changed:   how much to take  how to take this  when to take this  additional instructions   zinc sulfate 220 (50 Zn) MG  capsule Take 1 capsule (220 mg total) by mouth daily for 10 days. Start taking on: April 10, 2019        Follow-up Information    [facility] Follow up.   Why: You are scheduled for a telephonic hospital follow up call on 04/21/19 at 10:30am, please be available for this call Contact information: [place] [phone]          TOTAL DISCHARGE TIME: 1 minutes  [place]  [facility] Pager on [email or web address]   04/09/2019, 1:40 PM

## 2019-04-09 NOTE — Plan of Care (Signed)
  Problem: Health Behavior/Discharge Planning: Goal: Ability to manage health-related needs will improve Outcome: Adequate for Discharge   

## 2019-04-09 NOTE — Discharge Instructions (Signed)

## 2019-04-21 ENCOUNTER — Inpatient Hospital Stay (INDEPENDENT_AMBULATORY_CARE_PROVIDER_SITE_OTHER): Payer: Self-pay | Admitting: Primary Care

## 2021-06-16 ENCOUNTER — Encounter: Payer: Self-pay | Admitting: Gastroenterology

## 2021-12-04 DIAGNOSIS — Z6832 Body mass index (BMI) 32.0-32.9, adult: Secondary | ICD-10-CM | POA: Diagnosis not present

## 2021-12-04 DIAGNOSIS — L83 Acanthosis nigricans: Secondary | ICD-10-CM | POA: Diagnosis not present

## 2021-12-04 DIAGNOSIS — Z0000 Encounter for general adult medical examination without abnormal findings: Secondary | ICD-10-CM | POA: Diagnosis not present

## 2021-12-04 DIAGNOSIS — Z125 Encounter for screening for malignant neoplasm of prostate: Secondary | ICD-10-CM | POA: Diagnosis not present

## 2021-12-04 DIAGNOSIS — E6609 Other obesity due to excess calories: Secondary | ICD-10-CM | POA: Diagnosis not present

## 2021-12-04 DIAGNOSIS — I10 Essential (primary) hypertension: Secondary | ICD-10-CM | POA: Diagnosis not present

## 2021-12-04 DIAGNOSIS — Z129 Encounter for screening for malignant neoplasm, site unspecified: Secondary | ICD-10-CM | POA: Diagnosis not present

## 2021-12-04 DIAGNOSIS — D23121 Other benign neoplasm of skin of left upper eyelid, including canthus: Secondary | ICD-10-CM | POA: Diagnosis not present

## 2021-12-04 DIAGNOSIS — E782 Mixed hyperlipidemia: Secondary | ICD-10-CM | POA: Diagnosis not present

## 2021-12-04 DIAGNOSIS — E1169 Type 2 diabetes mellitus with other specified complication: Secondary | ICD-10-CM | POA: Diagnosis not present

## 2022-04-07 DIAGNOSIS — D23121 Other benign neoplasm of skin of left upper eyelid, including canthus: Secondary | ICD-10-CM | POA: Diagnosis not present

## 2022-04-07 DIAGNOSIS — Z7189 Other specified counseling: Secondary | ICD-10-CM | POA: Diagnosis not present

## 2022-04-07 DIAGNOSIS — M1711 Unilateral primary osteoarthritis, right knee: Secondary | ICD-10-CM | POA: Diagnosis not present

## 2022-04-07 DIAGNOSIS — I10 Essential (primary) hypertension: Secondary | ICD-10-CM | POA: Diagnosis not present

## 2022-04-07 DIAGNOSIS — E1169 Type 2 diabetes mellitus with other specified complication: Secondary | ICD-10-CM | POA: Diagnosis not present

## 2022-04-07 DIAGNOSIS — R7303 Prediabetes: Secondary | ICD-10-CM | POA: Diagnosis not present

## 2022-04-07 DIAGNOSIS — E782 Mixed hyperlipidemia: Secondary | ICD-10-CM | POA: Diagnosis not present

## 2022-04-30 DIAGNOSIS — D492 Neoplasm of unspecified behavior of bone, soft tissue, and skin: Secondary | ICD-10-CM | POA: Diagnosis not present

## 2022-04-30 DIAGNOSIS — H2513 Age-related nuclear cataract, bilateral: Secondary | ICD-10-CM | POA: Diagnosis not present

## 2022-05-06 DIAGNOSIS — D23121 Other benign neoplasm of skin of left upper eyelid, including canthus: Secondary | ICD-10-CM | POA: Diagnosis not present

## 2022-05-07 DIAGNOSIS — Z0100 Encounter for examination of eyes and vision without abnormal findings: Secondary | ICD-10-CM | POA: Diagnosis not present

## 2022-05-07 DIAGNOSIS — H524 Presbyopia: Secondary | ICD-10-CM | POA: Diagnosis not present

## 2022-07-14 DIAGNOSIS — R197 Diarrhea, unspecified: Secondary | ICD-10-CM | POA: Diagnosis not present

## 2022-07-14 DIAGNOSIS — I10 Essential (primary) hypertension: Secondary | ICD-10-CM | POA: Diagnosis not present

## 2022-07-22 DIAGNOSIS — Z0110 Encounter for examination of ears and hearing without abnormal findings: Secondary | ICD-10-CM | POA: Diagnosis not present

## 2022-07-22 DIAGNOSIS — H93293 Other abnormal auditory perceptions, bilateral: Secondary | ICD-10-CM | POA: Diagnosis not present

## 2022-08-04 DIAGNOSIS — R197 Diarrhea, unspecified: Secondary | ICD-10-CM | POA: Diagnosis not present

## 2022-08-04 DIAGNOSIS — I10 Essential (primary) hypertension: Secondary | ICD-10-CM | POA: Diagnosis not present

## 2022-12-01 DIAGNOSIS — M1711 Unilateral primary osteoarthritis, right knee: Secondary | ICD-10-CM | POA: Diagnosis not present

## 2022-12-01 DIAGNOSIS — E782 Mixed hyperlipidemia: Secondary | ICD-10-CM | POA: Diagnosis not present

## 2022-12-01 DIAGNOSIS — I10 Essential (primary) hypertension: Secondary | ICD-10-CM | POA: Diagnosis not present

## 2022-12-01 DIAGNOSIS — Z6832 Body mass index (BMI) 32.0-32.9, adult: Secondary | ICD-10-CM | POA: Diagnosis not present

## 2022-12-01 DIAGNOSIS — R7303 Prediabetes: Secondary | ICD-10-CM | POA: Diagnosis not present

## 2023-03-02 DIAGNOSIS — K625 Hemorrhage of anus and rectum: Secondary | ICD-10-CM | POA: Diagnosis not present

## 2023-03-02 DIAGNOSIS — Z6832 Body mass index (BMI) 32.0-32.9, adult: Secondary | ICD-10-CM | POA: Diagnosis not present

## 2023-03-02 DIAGNOSIS — I10 Essential (primary) hypertension: Secondary | ICD-10-CM | POA: Diagnosis not present

## 2023-03-02 DIAGNOSIS — Z125 Encounter for screening for malignant neoplasm of prostate: Secondary | ICD-10-CM | POA: Diagnosis not present

## 2023-03-02 DIAGNOSIS — E782 Mixed hyperlipidemia: Secondary | ICD-10-CM | POA: Diagnosis not present

## 2023-06-22 DIAGNOSIS — E782 Mixed hyperlipidemia: Secondary | ICD-10-CM | POA: Diagnosis not present

## 2023-06-22 DIAGNOSIS — R634 Abnormal weight loss: Secondary | ICD-10-CM | POA: Diagnosis not present

## 2023-06-22 DIAGNOSIS — I10 Essential (primary) hypertension: Secondary | ICD-10-CM | POA: Diagnosis not present

## 2023-06-22 DIAGNOSIS — Z6829 Body mass index (BMI) 29.0-29.9, adult: Secondary | ICD-10-CM | POA: Diagnosis not present

## 2023-06-22 DIAGNOSIS — R739 Hyperglycemia, unspecified: Secondary | ICD-10-CM | POA: Diagnosis not present

## 2023-06-22 DIAGNOSIS — R195 Other fecal abnormalities: Secondary | ICD-10-CM | POA: Diagnosis not present

## 2023-06-22 DIAGNOSIS — K625 Hemorrhage of anus and rectum: Secondary | ICD-10-CM | POA: Diagnosis not present

## 2023-06-24 DIAGNOSIS — E1122 Type 2 diabetes mellitus with diabetic chronic kidney disease: Secondary | ICD-10-CM | POA: Diagnosis not present

## 2023-06-24 DIAGNOSIS — E782 Mixed hyperlipidemia: Secondary | ICD-10-CM | POA: Diagnosis not present

## 2023-06-24 DIAGNOSIS — E1165 Type 2 diabetes mellitus with hyperglycemia: Secondary | ICD-10-CM | POA: Diagnosis not present

## 2023-06-24 DIAGNOSIS — N182 Chronic kidney disease, stage 2 (mild): Secondary | ICD-10-CM | POA: Diagnosis not present

## 2023-06-29 DIAGNOSIS — E1169 Type 2 diabetes mellitus with other specified complication: Secondary | ICD-10-CM | POA: Diagnosis not present

## 2023-06-29 DIAGNOSIS — E1165 Type 2 diabetes mellitus with hyperglycemia: Secondary | ICD-10-CM | POA: Diagnosis not present

## 2023-07-13 DIAGNOSIS — E1165 Type 2 diabetes mellitus with hyperglycemia: Secondary | ICD-10-CM | POA: Diagnosis not present

## 2023-07-13 DIAGNOSIS — E782 Mixed hyperlipidemia: Secondary | ICD-10-CM | POA: Diagnosis not present

## 2023-07-13 DIAGNOSIS — E1169 Type 2 diabetes mellitus with other specified complication: Secondary | ICD-10-CM | POA: Diagnosis not present

## 2023-08-10 DIAGNOSIS — E1165 Type 2 diabetes mellitus with hyperglycemia: Secondary | ICD-10-CM | POA: Diagnosis not present

## 2023-08-10 DIAGNOSIS — E1169 Type 2 diabetes mellitus with other specified complication: Secondary | ICD-10-CM | POA: Diagnosis not present

## 2023-08-10 DIAGNOSIS — Z6829 Body mass index (BMI) 29.0-29.9, adult: Secondary | ICD-10-CM | POA: Diagnosis not present

## 2023-08-10 DIAGNOSIS — Z794 Long term (current) use of insulin: Secondary | ICD-10-CM | POA: Diagnosis not present

## 2023-08-10 DIAGNOSIS — E782 Mixed hyperlipidemia: Secondary | ICD-10-CM | POA: Diagnosis not present

## 2023-08-10 DIAGNOSIS — I10 Essential (primary) hypertension: Secondary | ICD-10-CM | POA: Diagnosis not present

## 2023-09-07 DIAGNOSIS — E782 Mixed hyperlipidemia: Secondary | ICD-10-CM | POA: Diagnosis not present

## 2023-09-07 DIAGNOSIS — Z794 Long term (current) use of insulin: Secondary | ICD-10-CM | POA: Diagnosis not present

## 2023-09-07 DIAGNOSIS — E1165 Type 2 diabetes mellitus with hyperglycemia: Secondary | ICD-10-CM | POA: Diagnosis not present

## 2023-09-07 DIAGNOSIS — R531 Weakness: Secondary | ICD-10-CM | POA: Diagnosis not present

## 2023-09-07 DIAGNOSIS — E1169 Type 2 diabetes mellitus with other specified complication: Secondary | ICD-10-CM | POA: Diagnosis not present

## 2023-11-17 ENCOUNTER — Telehealth: Payer: Self-pay

## 2023-11-17 NOTE — Progress Notes (Addendum)
   11/17/2023  Patient ID: Brandon Colon, male   DOB: 1955/09/23, 68 y.o.   MRN: [id number]  Contacted patient regarding diabetes from a quality report for [facility]. Patient was on [facility] DM roster for A1c > 8. The patient has an upcoming PCP visit on 11/23/23. I will make another outreach attempt prior to the appointment.   I was unable to leave a voicemail as the patient's mailbox was full.   Livia Riffle, PharmD Clinical Pharmacist  [phone]

## 2023-11-23 DIAGNOSIS — E782 Mixed hyperlipidemia: Secondary | ICD-10-CM | POA: Diagnosis not present

## 2023-11-23 DIAGNOSIS — Z794 Long term (current) use of insulin: Secondary | ICD-10-CM | POA: Diagnosis not present

## 2023-11-23 DIAGNOSIS — Z6830 Body mass index (BMI) 30.0-30.9, adult: Secondary | ICD-10-CM | POA: Diagnosis not present

## 2023-11-23 DIAGNOSIS — E1169 Type 2 diabetes mellitus with other specified complication: Secondary | ICD-10-CM | POA: Diagnosis not present

## 2023-11-23 DIAGNOSIS — I10 Essential (primary) hypertension: Secondary | ICD-10-CM | POA: Diagnosis not present

## 2023-12-10 NOTE — Progress Notes (Signed)
   12/10/2023  Patient ID: Brandon Colon, male   DOB: [birth year], 68 y.o.   MRN: [id number]  Contacted patient regarding diabetes from a quality report for [facility]. Patient was on [facility] DM roster for A1c > 8. I was unable to leave a voicemail as the patient's mailbox was full.   Livia Riffle, PharmD Clinical Pharmacist  [phone]

## 2024-02-15 DIAGNOSIS — E1169 Type 2 diabetes mellitus with other specified complication: Secondary | ICD-10-CM | POA: Diagnosis not present

## 2024-02-15 DIAGNOSIS — E7870 Disorder of bile acid and cholesterol metabolism, unspecified: Secondary | ICD-10-CM | POA: Diagnosis not present

## 2024-02-15 DIAGNOSIS — E782 Mixed hyperlipidemia: Secondary | ICD-10-CM | POA: Diagnosis not present

## 2024-02-15 DIAGNOSIS — Z0000 Encounter for general adult medical examination without abnormal findings: Secondary | ICD-10-CM | POA: Diagnosis not present

## 2024-02-15 DIAGNOSIS — I10 Essential (primary) hypertension: Secondary | ICD-10-CM | POA: Diagnosis not present

## 2024-05-11 DIAGNOSIS — H2513 Age-related nuclear cataract, bilateral: Secondary | ICD-10-CM | POA: Diagnosis not present

## 2024-05-11 DIAGNOSIS — H02821 Cysts of right upper eyelid: Secondary | ICD-10-CM | POA: Diagnosis not present

## 2024-05-11 DIAGNOSIS — H0102A Squamous blepharitis right eye, upper and lower eyelids: Secondary | ICD-10-CM | POA: Diagnosis not present

## 2024-05-11 DIAGNOSIS — H0102B Squamous blepharitis left eye, upper and lower eyelids: Secondary | ICD-10-CM | POA: Diagnosis not present

## 2024-05-16 DIAGNOSIS — I10 Essential (primary) hypertension: Secondary | ICD-10-CM | POA: Diagnosis not present

## 2024-05-16 DIAGNOSIS — E1169 Type 2 diabetes mellitus with other specified complication: Secondary | ICD-10-CM | POA: Diagnosis not present

## 2024-05-16 DIAGNOSIS — M545 Low back pain, unspecified: Secondary | ICD-10-CM | POA: Diagnosis not present

## 2024-05-30 DIAGNOSIS — D23111 Other benign neoplasm of skin of right upper eyelid, including canthus: Secondary | ICD-10-CM | POA: Diagnosis not present

## 2024-05-30 DIAGNOSIS — H02821 Cysts of right upper eyelid: Secondary | ICD-10-CM | POA: Diagnosis not present

## 2024-06-06 DIAGNOSIS — H0102B Squamous blepharitis left eye, upper and lower eyelids: Secondary | ICD-10-CM | POA: Diagnosis not present

## 2024-06-06 DIAGNOSIS — H2513 Age-related nuclear cataract, bilateral: Secondary | ICD-10-CM | POA: Diagnosis not present

## 2024-06-06 DIAGNOSIS — H0102A Squamous blepharitis right eye, upper and lower eyelids: Secondary | ICD-10-CM | POA: Diagnosis not present

## 2024-06-06 DIAGNOSIS — H02824 Cysts of left upper eyelid: Secondary | ICD-10-CM | POA: Diagnosis not present

## 2024-08-08 ENCOUNTER — Encounter: Payer: Self-pay | Admitting: *Deleted
